# Patient Record
Sex: Female | Born: 1955
Health system: Southern US, Community
[De-identification: ages and names within clinical notes are randomized; demographics above are authoritative.]

## PROBLEM LIST (undated history)

## (undated) DIAGNOSIS — D649 Anemia, unspecified: Secondary | ICD-10-CM

## (undated) DIAGNOSIS — E559 Vitamin D deficiency, unspecified: Secondary | ICD-10-CM

## (undated) DIAGNOSIS — M1990 Unspecified osteoarthritis, unspecified site: Secondary | ICD-10-CM

## (undated) DIAGNOSIS — J45909 Unspecified asthma, uncomplicated: Secondary | ICD-10-CM

## (undated) DIAGNOSIS — K219 Gastro-esophageal reflux disease without esophagitis: Secondary | ICD-10-CM

## (undated) DIAGNOSIS — E0500 Thyrotoxicosis with diffuse goiter without thyrotoxic crisis or storm: Secondary | ICD-10-CM

## (undated) DIAGNOSIS — E039 Hypothyroidism, unspecified: Secondary | ICD-10-CM

## (undated) HISTORY — DX: Anemia, unspecified: D64.9

## (undated) HISTORY — DX: Thyrotoxicosis with diffuse goiter without thyrotoxic crisis or storm: E05.00

## (undated) HISTORY — DX: Unspecified osteoarthritis, unspecified site: M19.90

## (undated) HISTORY — PX: BARTHOLIN GLAND CYST EXCISION: SHX565

## (undated) HISTORY — DX: Vitamin D deficiency, unspecified: E55.9

---

## 2006-08-15 ENCOUNTER — Ambulatory Visit: Payer: Self-pay | Admitting: Internal Medicine

## 2006-08-15 ENCOUNTER — Ambulatory Visit (HOSPITAL_COMMUNITY): Admission: RE | Admit: 2006-08-15 | Discharge: 2006-08-15 | Payer: Self-pay | Admitting: Internal Medicine

## 2013-08-15 ENCOUNTER — Encounter (INDEPENDENT_AMBULATORY_CARE_PROVIDER_SITE_OTHER): Payer: Self-pay

## 2013-12-06 DIAGNOSIS — Z7189 Other specified counseling: Secondary | ICD-10-CM | POA: Insufficient documentation

## 2015-03-21 ENCOUNTER — Other Ambulatory Visit: Payer: Self-pay | Admitting: Ophthalmology

## 2015-03-21 DIAGNOSIS — H532 Diplopia: Secondary | ICD-10-CM

## 2015-03-25 ENCOUNTER — Ambulatory Visit (HOSPITAL_COMMUNITY)
Admission: RE | Admit: 2015-03-25 | Discharge: 2015-03-25 | Disposition: A | Discharge status: Home / Self Care | Payer: BLUE CROSS/BLUE SHIELD | Source: Ambulatory Visit | Attending: Ophthalmology | Admitting: Ophthalmology

## 2015-03-25 DIAGNOSIS — H532 Diplopia: Secondary | ICD-10-CM | POA: Insufficient documentation

## 2015-06-15 DIAGNOSIS — E079 Disorder of thyroid, unspecified: Secondary | ICD-10-CM

## 2015-06-15 DIAGNOSIS — E0500 Thyrotoxicosis with diffuse goiter without thyrotoxic crisis or storm: Secondary | ICD-10-CM

## 2015-06-15 HISTORY — DX: Disorder of thyroid, unspecified: E07.9

## 2015-06-15 HISTORY — DX: Thyrotoxicosis with diffuse goiter without thyrotoxic crisis or storm: E05.00

## 2016-06-21 DIAGNOSIS — Z299 Encounter for prophylactic measures, unspecified: Secondary | ICD-10-CM | POA: Diagnosis not present

## 2016-06-21 DIAGNOSIS — Z789 Other specified health status: Secondary | ICD-10-CM | POA: Diagnosis not present

## 2016-06-21 DIAGNOSIS — R509 Fever, unspecified: Secondary | ICD-10-CM | POA: Diagnosis not present

## 2016-06-21 DIAGNOSIS — J069 Acute upper respiratory infection, unspecified: Secondary | ICD-10-CM | POA: Diagnosis not present

## 2016-08-20 ENCOUNTER — Encounter (INDEPENDENT_AMBULATORY_CARE_PROVIDER_SITE_OTHER): Payer: Self-pay | Admitting: *Deleted

## 2016-09-09 ENCOUNTER — Other Ambulatory Visit (INDEPENDENT_AMBULATORY_CARE_PROVIDER_SITE_OTHER): Payer: Self-pay | Admitting: *Deleted

## 2016-09-09 DIAGNOSIS — Z1211 Encounter for screening for malignant neoplasm of colon: Secondary | ICD-10-CM

## 2016-10-12 DIAGNOSIS — Z6832 Body mass index (BMI) 32.0-32.9, adult: Secondary | ICD-10-CM | POA: Diagnosis not present

## 2016-10-12 DIAGNOSIS — J029 Acute pharyngitis, unspecified: Secondary | ICD-10-CM | POA: Diagnosis not present

## 2016-10-12 DIAGNOSIS — M8580 Other specified disorders of bone density and structure, unspecified site: Secondary | ICD-10-CM | POA: Diagnosis not present

## 2016-10-12 DIAGNOSIS — R509 Fever, unspecified: Secondary | ICD-10-CM | POA: Diagnosis not present

## 2016-10-12 DIAGNOSIS — Z789 Other specified health status: Secondary | ICD-10-CM | POA: Diagnosis not present

## 2016-10-12 DIAGNOSIS — Z299 Encounter for prophylactic measures, unspecified: Secondary | ICD-10-CM | POA: Diagnosis not present

## 2016-10-12 DIAGNOSIS — E039 Hypothyroidism, unspecified: Secondary | ICD-10-CM | POA: Diagnosis not present

## 2016-10-14 DIAGNOSIS — Z6831 Body mass index (BMI) 31.0-31.9, adult: Secondary | ICD-10-CM | POA: Diagnosis not present

## 2016-10-14 DIAGNOSIS — Z299 Encounter for prophylactic measures, unspecified: Secondary | ICD-10-CM | POA: Diagnosis not present

## 2016-10-14 DIAGNOSIS — E039 Hypothyroidism, unspecified: Secondary | ICD-10-CM | POA: Diagnosis not present

## 2016-10-14 DIAGNOSIS — J029 Acute pharyngitis, unspecified: Secondary | ICD-10-CM | POA: Diagnosis not present

## 2016-10-14 DIAGNOSIS — M8580 Other specified disorders of bone density and structure, unspecified site: Secondary | ICD-10-CM | POA: Diagnosis not present

## 2016-10-14 DIAGNOSIS — Z789 Other specified health status: Secondary | ICD-10-CM | POA: Diagnosis not present

## 2016-10-14 DIAGNOSIS — D509 Iron deficiency anemia, unspecified: Secondary | ICD-10-CM | POA: Diagnosis not present

## 2016-10-15 DIAGNOSIS — J029 Acute pharyngitis, unspecified: Secondary | ICD-10-CM | POA: Diagnosis not present

## 2016-11-16 DIAGNOSIS — E0500 Thyrotoxicosis with diffuse goiter without thyrotoxic crisis or storm: Secondary | ICD-10-CM | POA: Diagnosis not present

## 2016-11-17 ENCOUNTER — Encounter (INDEPENDENT_AMBULATORY_CARE_PROVIDER_SITE_OTHER): Payer: Self-pay | Admitting: *Deleted

## 2016-11-17 ENCOUNTER — Telehealth (INDEPENDENT_AMBULATORY_CARE_PROVIDER_SITE_OTHER): Payer: Self-pay | Admitting: *Deleted

## 2016-11-17 MED ORDER — PEG 3350-KCL-NA BICARB-NACL 420 G PO SOLR: 4000.0000 mL | Freq: Once | ORAL | 0 refills | Status: AC

## 2016-11-17 NOTE — Telephone Encounter (Signed)
Patient needs trilyte 

## 2016-12-10 ENCOUNTER — Telehealth (INDEPENDENT_AMBULATORY_CARE_PROVIDER_SITE_OTHER): Payer: Self-pay | Admitting: *Deleted

## 2016-12-10 NOTE — Telephone Encounter (Signed)
Referring MD/PCP: tcs   Procedure: screening  Reason/Indication:  Yes, over 10 yrs ago  Has patient had this procedure before?  no  If so, when, by whom and where?    Is there a family history of colon cancer?  no  Who?  What age when diagnosed?    Is patient diabetic?   no      Does patient have prosthetic heart valve or mechanical valve?  no  Do you have a pacemaker?  no  Has patient ever had endocarditis? no  Has patient had joint replacement within last 12 months?  no  Does patient tend to be constipated or take laxatives? yes  Does patient have a history of alcohol/drug use?  no  Is patient on Coumadin, Plavix and/or Aspirin? no  Medications: levothyroxine 125 mcg daily, zantac prn  Allergies: relfen  Medication Adjustment per Dr Laural Golden:   Procedure date & time: 01/06/17 at 73

## 2016-12-13 NOTE — Telephone Encounter (Signed)
agree

## 2017-01-06 ENCOUNTER — Ambulatory Visit (HOSPITAL_COMMUNITY)
Admission: RE | Admit: 2017-01-06 | Discharge: 2017-01-06 | Disposition: A | Discharge status: Home / Self Care | Payer: BLUE CROSS/BLUE SHIELD | Source: Ambulatory Visit | Attending: Internal Medicine | Admitting: Internal Medicine

## 2017-01-06 ENCOUNTER — Encounter (HOSPITAL_COMMUNITY)
Admission: RE | Disposition: A | Discharge status: Home / Self Care | Payer: Self-pay | Source: Ambulatory Visit | Attending: Internal Medicine

## 2017-01-06 ENCOUNTER — Encounter (HOSPITAL_COMMUNITY): Payer: Self-pay

## 2017-01-06 DIAGNOSIS — E039 Hypothyroidism, unspecified: Secondary | ICD-10-CM | POA: Diagnosis not present

## 2017-01-06 DIAGNOSIS — Z1211 Encounter for screening for malignant neoplasm of colon: Secondary | ICD-10-CM | POA: Insufficient documentation

## 2017-01-06 DIAGNOSIS — Z79899 Other long term (current) drug therapy: Secondary | ICD-10-CM | POA: Diagnosis not present

## 2017-01-06 DIAGNOSIS — J45909 Unspecified asthma, uncomplicated: Secondary | ICD-10-CM | POA: Insufficient documentation

## 2017-01-06 DIAGNOSIS — K219 Gastro-esophageal reflux disease without esophagitis: Secondary | ICD-10-CM | POA: Insufficient documentation

## 2017-01-06 DIAGNOSIS — K644 Residual hemorrhoidal skin tags: Secondary | ICD-10-CM | POA: Insufficient documentation

## 2017-01-06 DIAGNOSIS — D120 Benign neoplasm of cecum: Secondary | ICD-10-CM | POA: Insufficient documentation

## 2017-01-06 DIAGNOSIS — K6289 Other specified diseases of anus and rectum: Secondary | ICD-10-CM | POA: Diagnosis not present

## 2017-01-06 HISTORY — DX: Gastro-esophageal reflux disease without esophagitis: K21.9

## 2017-01-06 HISTORY — PX: POLYPECTOMY: SHX5525

## 2017-01-06 HISTORY — DX: Hypothyroidism, unspecified: E03.9

## 2017-01-06 HISTORY — PX: COLONOSCOPY: SHX5424

## 2017-01-06 HISTORY — DX: Unspecified asthma, uncomplicated: J45.909

## 2017-01-06 SURGERY — COLONOSCOPY
Anesthesia: Moderate Sedation

## 2017-01-06 MED ORDER — STERILE WATER FOR IRRIGATION IR SOLN
Status: DC | PRN
  Administered 2017-01-06: 09:00:00

## 2017-01-06 MED ORDER — MIDAZOLAM HCL 5 MG/5ML IJ SOLN
INTRAMUSCULAR | Status: AC
  Filled 2017-01-06: qty 10

## 2017-01-06 MED ORDER — MIDAZOLAM HCL 5 MG/5ML IJ SOLN
INTRAMUSCULAR | Status: DC | PRN
  Administered 2017-01-06 (×2): 2 mg via INTRAVENOUS
  Administered 2017-01-06: 1 mg via INTRAVENOUS

## 2017-01-06 MED ORDER — MEPERIDINE HCL 50 MG/ML IJ SOLN
INTRAMUSCULAR | Status: DC | PRN
  Administered 2017-01-06 (×2): 25 mg via INTRAVENOUS

## 2017-01-06 MED ORDER — SODIUM CHLORIDE 0.9 % IV SOLN
INTRAVENOUS | Status: DC
  Administered 2017-01-06: 1000 mL via INTRAVENOUS

## 2017-01-06 MED ORDER — MEPERIDINE HCL 50 MG/ML IJ SOLN
INTRAMUSCULAR | Status: AC
  Filled 2017-01-06: qty 1

## 2017-01-06 NOTE — Op Note (Signed)
[facility] Patient Name: Misty Gomez Procedure Date: 01/06/2017 8:49 AM MRN: [id number] Date of Birth: 1955-10-22 Attending MD: Hildred Laser , MD CSN: [id number] Age: 61 Admit Type: Outpatient Procedure:                Colonoscopy Indications:              Screening for colorectal malignant neoplasm Providers:                Hildred Laser, MD, Otis Peak B. Sharon Seller, RN, Randa Spike, Technician Referring MD:             Virl Son. Hairfield, FNP Medicines:                Meperidine 50 mg IV, Midazolam 5 mg IV Complications:            No immediate complications. Estimated Blood Loss:     Estimated blood loss was minimal. Procedure:                Pre-Anesthesia Assessment:                           - Prior to the procedure, a History and Physical                            was performed, and patient medications and                            allergies were reviewed. The patient's tolerance of                            previous anesthesia was also reviewed. The risks                            and benefits of the procedure and the sedation                            options and risks were discussed with the patient.                            All questions were answered, and informed consent                            was obtained. Prior Anticoagulants: The patient has                            taken no previous anticoagulant or antiplatelet                            agents. ASA Grade Assessment: II - A patient with                            mild systemic disease. After reviewing the risks  and benefits, the patient was deemed in                            satisfactory condition to undergo the procedure.                           After obtaining informed consent, the colonoscope                            was passed under direct vision. Throughout the                            procedure, the patient's blood pressure,  pulse, and                            oxygen saturations were monitored continuously. The                            [id number] ([id number]) scope was introduced through                            the anus and advanced to the the cecum, identified                            by appendiceal orifice and ileocecal valve. The                            colonoscopy was performed without difficulty. The                            patient tolerated the procedure well. The quality                            of the bowel preparation was good. The ileocecal                            valve, appendiceal orifice, and rectum were                            photographed. Scope In: 9:04:14 AM Scope Out: 9:29:50 AM Scope Withdrawal Time: 0 hours 14 minutes 13 seconds  Total Procedure Duration: 0 hours 25 minutes 36 seconds  Findings:      The perianal and digital rectal examinations were normal.      A small polyp was found in the cecum. The polyp was sessile. Biopsies       were taken with a cold forceps for histology.      The exam was otherwise normal throughout the examined colon.      External hemorrhoids were found during retroflexion. The hemorrhoids       were small.      Anal papilla(e) were hypertrophied. Impression:               - One small polyp in the cecum. Biopsied.                           -  External hemorrhoids.                           - Anal papilla(e) were hypertrophied. Moderate Sedation:      Moderate (conscious) sedation was administered by the endoscopy nurse       and supervised by the endoscopist. The following parameters were       monitored: oxygen saturation, heart rate, blood pressure, CO2       capnography and response to care. Total physician intraservice time was       30 minutes. Recommendation:           - Patient has a contact number available for                            emergencies. The signs and symptoms of potential                            delayed  complications were discussed with the                            patient. Return to normal activities tomorrow.                            Written discharge instructions were provided to the                            patient.                           - Resume previous diet today.                           - Continue present medications.                           - Await pathology results.                           - Repeat colonoscopy date to be determined after                            pending pathology results are reviewed. Procedure Code(s):        --- Professional ---                           [phone], Colonoscopy, flexible; with biopsy, single                            or multiple                           99152, Moderate sedation services provided by the                            same physician or other qualified health care  professional performing the diagnostic or                            therapeutic service that the sedation supports,                            requiring the presence of an independent trained                            observer to assist in the monitoring of the                            patient's level of consciousness and physiological                            status; initial 15 minutes of intraservice time,                            patient age 26 years or older                           [id number], Moderate sedation services; each additional                            15 minutes intraservice time Diagnosis Code(s):        --- Professional ---                           Z12.11, Encounter for screening for malignant                            neoplasm of colon                           D12.0, Benign neoplasm of cecum                           K62.89, Other specified diseases of anus and rectum                           K64.4, Residual hemorrhoidal skin tags CPT copyright 2016 [employer]. All rights reserved. The  codes documented in this report are preliminary and upon coder review may  be revised to meet current compliance requirements. Hildred Laser, MD Hildred Laser, MD 01/06/2017 9:36:15 AM This report has been signed electronically. Number of Addenda: 0

## 2017-01-06 NOTE — Discharge Instructions (Signed)
Colonoscopy, Adult, Care After °This sheet gives you information about how to care for yourself after your procedure. Your health care provider may also give you more specific instructions. If you have problems or questions, contact your health care provider. °What can I expect after the procedure? °After the procedure, it is common to have: °· A small amount of blood in your stool for 24 hours after the procedure. °· Some gas. °· Mild abdominal cramping or bloating. ° °Follow these instructions at home: °General instructions ° °· For the first 24 hours after the procedure: °? Do not drive or use machinery. °? Do not sign important documents. °? Do not drink alcohol. °? Do your regular daily activities at a slower pace than normal. °? Eat soft, easy-to-digest foods. °? Rest often. °· Take over-the-counter or prescription medicines only as told by your health care provider. °· It is up to you to get the results of your procedure. Ask your health care provider, or the department performing the procedure, when your results will be ready. °Relieving cramping and bloating °· Try walking around when you have cramps or feel bloated. °· Apply heat to your abdomen as told by your health care provider. Use a heat source that your health care provider recommends, such as a moist heat pack or a heating pad. °? Place a towel between your skin and the heat source. °? Leave the heat on for 20-30 minutes. °? Remove the heat if your skin turns bright red. This is especially important if you are unable to feel pain, heat, or cold. You may have a greater risk of getting burned. °Eating and drinking °· Drink enough fluid to keep your urine clear or pale yellow. °· Resume your normal diet as instructed by your health care provider. Avoid heavy or fried foods that are hard to digest. °· Avoid drinking alcohol for as long as instructed by your health care provider. °Contact a health care provider if: °· You have blood in your stool 2-3  days after the procedure. °Get help right away if: °· You have more than a small spotting of blood in your stool. °· You pass large blood clots in your stool. °· Your abdomen is swollen. °· You have nausea or vomiting. °· You have a fever. °· You have increasing abdominal pain that is not relieved with medicine. °This information is not intended to replace advice given to you by your health care provider. Make sure you discuss any questions you have with your health care provider. °Document Released: 01/13/2004 Document Revised: 02/23/2016 Document Reviewed: 08/12/2015 °Elsevier Interactive Patient Education © 2018 [employer] °Resume usual medications and diet. °No driving for 24 hours. °Physician will call with biopsy results. °

## 2017-01-06 NOTE — H&P (Signed)
Misty Gomez is an 61 y.o. female.   Chief Complaint: Patient is here for colonoscopy. HPI: Patient 61 year old Caucasian female who is here for screening colonoscopy. She denies abdominal pain change in bowel habits or rectal bleeding. Her bowels move once a week and it has been her pattern for several years. Last colonoscopy was in March 2008. Family's reason negative for CRC.  Past Medical History:  Diagnosis Date  . Asthma   . GERD (gastroesophageal reflux disease)   . Hypothyroidism     Past Surgical History:  Procedure Laterality Date  . BARTHOLIN GLAND CYST EXCISION      History reviewed. No pertinent family history. Social History:  reports that she has never smoked. She has never used smokeless tobacco. She reports that she does not drink alcohol or use drugs.  Allergies:  Allergies  Allergen Reactions  . Relafen [Nabumetone] Rash    All over    Medications Prior to Admission  Medication Sig Dispense Refill  . albuterol (PROVENTIL HFA;VENTOLIN HFA) 108 (90 Base) MCG/ACT inhaler Inhale 1-2 puffs into the lungs every 4 (four) hours as needed for wheezing or shortness of breath.    . levothyroxine (SYNTHROID, LEVOTHROID) 125 MCG tablet Take 1 tablet by mouth at bedtime.   4  . ranitidine (ZANTAC) 150 MG capsule Take 150 mg by mouth daily as needed for heartburn.      No results found for this or any previous visit (from the past 48 hour(s)). No results found.  ROS  Blood pressure 123/70, pulse (!) 51, temperature 98 F (36.7 C), temperature source Oral, resp. rate 16, SpO2 100 %. Physical Exam  Constitutional: She appears well-developed and well-nourished.  HENT:  Mouth/Throat: Oropharynx is clear and moist.  Eyes: Conjunctivae are normal. No scleral icterus.  Neck: No thyromegaly present.  Cardiovascular: Normal rate, regular rhythm and normal heart sounds.   No murmur heard. Respiratory: Effort normal and breath sounds normal.  GI: Soft. She exhibits no  distension and no mass. There is no tenderness.  Musculoskeletal: She exhibits no edema.  Lymphadenopathy:    She has no cervical adenopathy.  Neurological: She is alert.  Skin: Skin is warm and dry.     Assessment/Plan Average risk screening colonoscopy.  Hildred Laser, MD 01/06/2017, 8:55 AM

## 2017-01-10 ENCOUNTER — Encounter (HOSPITAL_COMMUNITY): Payer: Self-pay | Admitting: Internal Medicine

## 2017-01-20 DIAGNOSIS — E039 Hypothyroidism, unspecified: Secondary | ICD-10-CM | POA: Diagnosis not present

## 2017-01-20 DIAGNOSIS — Z6832 Body mass index (BMI) 32.0-32.9, adult: Secondary | ICD-10-CM | POA: Diagnosis not present

## 2017-01-20 DIAGNOSIS — Z1231 Encounter for screening mammogram for malignant neoplasm of breast: Secondary | ICD-10-CM | POA: Diagnosis not present

## 2017-01-20 DIAGNOSIS — Z0000 Encounter for general adult medical examination without abnormal findings: Secondary | ICD-10-CM | POA: Diagnosis not present

## 2017-01-20 DIAGNOSIS — Z1389 Encounter for screening for other disorder: Secondary | ICD-10-CM | POA: Diagnosis not present

## 2017-01-20 DIAGNOSIS — Z79899 Other long term (current) drug therapy: Secondary | ICD-10-CM | POA: Diagnosis not present

## 2017-01-20 DIAGNOSIS — Z299 Encounter for prophylactic measures, unspecified: Secondary | ICD-10-CM | POA: Diagnosis not present

## 2017-01-20 DIAGNOSIS — E559 Vitamin D deficiency, unspecified: Secondary | ICD-10-CM | POA: Diagnosis not present

## 2017-01-20 DIAGNOSIS — D649 Anemia, unspecified: Secondary | ICD-10-CM | POA: Diagnosis not present

## 2017-02-07 DIAGNOSIS — Z1231 Encounter for screening mammogram for malignant neoplasm of breast: Secondary | ICD-10-CM | POA: Diagnosis not present

## 2017-07-12 DIAGNOSIS — J189 Pneumonia, unspecified organism: Secondary | ICD-10-CM | POA: Diagnosis not present

## 2017-07-12 DIAGNOSIS — R05 Cough: Secondary | ICD-10-CM | POA: Diagnosis not present

## 2017-07-12 DIAGNOSIS — J4541 Moderate persistent asthma with (acute) exacerbation: Secondary | ICD-10-CM | POA: Diagnosis not present

## 2017-07-12 DIAGNOSIS — J069 Acute upper respiratory infection, unspecified: Secondary | ICD-10-CM | POA: Diagnosis not present

## 2017-11-21 DIAGNOSIS — Z6831 Body mass index (BMI) 31.0-31.9, adult: Secondary | ICD-10-CM | POA: Diagnosis not present

## 2017-11-21 DIAGNOSIS — Z01419 Encounter for gynecological examination (general) (routine) without abnormal findings: Secondary | ICD-10-CM | POA: Diagnosis not present

## 2017-12-05 DIAGNOSIS — E0580 Other thyrotoxicosis without thyrotoxic crisis or storm: Secondary | ICD-10-CM | POA: Diagnosis not present

## 2017-12-05 DIAGNOSIS — E0500 Thyrotoxicosis with diffuse goiter without thyrotoxic crisis or storm: Secondary | ICD-10-CM | POA: Diagnosis not present

## 2018-01-17 ENCOUNTER — Encounter: Payer: Self-pay | Admitting: Family Medicine

## 2018-02-22 ENCOUNTER — Encounter: Payer: Self-pay | Admitting: Family Medicine

## 2018-02-24 ENCOUNTER — Ambulatory Visit (INDEPENDENT_AMBULATORY_CARE_PROVIDER_SITE_OTHER): Payer: BLUE CROSS/BLUE SHIELD

## 2018-02-24 ENCOUNTER — Encounter: Payer: Self-pay | Admitting: Family Medicine

## 2018-02-24 ENCOUNTER — Ambulatory Visit: Payer: BLUE CROSS/BLUE SHIELD | Admitting: Family Medicine

## 2018-02-24 VITALS — BP 124/82 | HR 58 | Temp 97.4°F | Ht >= 80 in | Wt 189.0 lb

## 2018-02-24 DIAGNOSIS — E890 Postprocedural hypothyroidism: Secondary | ICD-10-CM

## 2018-02-24 DIAGNOSIS — Z13228 Encounter for screening for other metabolic disorders: Secondary | ICD-10-CM | POA: Diagnosis not present

## 2018-02-24 DIAGNOSIS — M8580 Other specified disorders of bone density and structure, unspecified site: Secondary | ICD-10-CM

## 2018-02-24 DIAGNOSIS — M8588 Other specified disorders of bone density and structure, other site: Secondary | ICD-10-CM | POA: Insufficient documentation

## 2018-02-24 DIAGNOSIS — Z1239 Encounter for other screening for malignant neoplasm of breast: Secondary | ICD-10-CM

## 2018-02-24 DIAGNOSIS — D649 Anemia, unspecified: Secondary | ICD-10-CM | POA: Diagnosis not present

## 2018-02-24 DIAGNOSIS — E559 Vitamin D deficiency, unspecified: Secondary | ICD-10-CM | POA: Insufficient documentation

## 2018-02-24 DIAGNOSIS — Z7689 Persons encountering health services in other specified circumstances: Secondary | ICD-10-CM

## 2018-02-24 DIAGNOSIS — Z13220 Encounter for screening for lipoid disorders: Secondary | ICD-10-CM

## 2018-02-24 DIAGNOSIS — Z1231 Encounter for screening mammogram for malignant neoplasm of breast: Secondary | ICD-10-CM

## 2018-02-24 NOTE — Progress Notes (Signed)
Subjective: YB:OFBPZWCHE care, hypothyroidism HPI: Misty Gomez is a 62 y.o. female presenting to clinic today for:  1.  Hypothyroidism Patient reports that she has post radio ablative hypothyroidism since 2010.  She was diagnosed with Graves' disease and has been on Synthroid 125 mcg.  She denies any tremor, insomnia, anxiety, change in voice or difficulty swallowing.  She does have some visual complications of Graves' disease, which she is treated for by her ophthalmologist.  She notes she is to see double but now seeing these in the singular.  No diarrhea or constipation.  2.  Osteopenia Patient reports history of osteopenia.  Last bone density was a few years ago.  She also has history of vitamin D deficiency.  She reports regular physical activity and good balanced diet.  3.  Acid reflux Patient reports intermittent acid reflux that is controlled by Zantac.  She notes that it is predominantly related to what she eats.  Denies any hematochezia, melena, nausea or vomiting.  4.  Preventive care Patient reports that she had her Pap smear done by Dr. Adah Perl last year and this was normal.  She gets her mammograms done at the [facility] center and is planning on scheduling this soon.  She needs her pneumococcal and tetanus vaccines.  Past Medical History:  Diagnosis Date  . Anemia   . Arthritis   . Asthma   . GERD (gastroesophageal reflux disease)   . Hypothyroidism   . Thyroid eye disease 2017  . Vitamin D deficiency    Past Surgical History:  Procedure Laterality Date  . BARTHOLIN GLAND CYST EXCISION    . COLONOSCOPY N/A 01/06/2017   Procedure: COLONOSCOPY;  Surgeon: Rogene Houston, MD;  Location: [facility];  Service: Endoscopy;  Laterality: N/A;  930  . POLYPECTOMY  01/06/2017   Procedure: POLYPECTOMY;  Surgeon: Rogene Houston, MD;  Location: [facility];  Service: Endoscopy;;  colon   Social History   Socioeconomic History  . Marital status: Married    Spouse  name: Not on file  . Number of children: 0  . Years of education: Not on file  . Highest education level: Not on file  Occupational History  . Not on file  Social Needs  . Financial resource strain: Not on file  . Food insecurity:    Worry: Not on file    Inability: Not on file  . Transportation needs:    Medical: Not on file    Non-medical: Not on file  Tobacco Use  . Smoking status: Never Smoker  . Smokeless tobacco: Never Used  Substance and Sexual Activity  . Alcohol use: No  . Drug use: No  . Sexual activity: Not on file  Lifestyle  . Physical activity:    Days per week: Not on file    Minutes per session: Not on file  . Stress: Not on file  Relationships  . Social connections:    Talks on phone: Not on file    Gets together: Not on file    Attends religious service: Not on file    Active member of club or organization: Not on file    Attends meetings of clubs or organizations: Not on file    Relationship status: Not on file  . Intimate partner violence:    Fear of current or ex partner: Not on file    Emotionally abused: Not on file    Physically abused: Not on file    Forced sexual activity:  Not on file  Other Topics Concern  . Not on file  Social History Narrative   Ms. 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   She walks several times weekly.   No outpatient medications have been marked as taking for the 02/24/18 encounter (Office Visit) with Gottschalk, Ashly M, DO.   Family History  Problem Relation Age of Onset  . CVA Mother 60  . Rheum arthritis Mother   . Heart attack Mother 60  . Heart attack Father 40       had first heart attack in early 40s  . Heart disease Father   . Cancer Brother   . Heart disease Brother   . Cancer Brother   . Diabetes Brother   . Hypertension Brother   . Pancreatic disease Maternal Grandmother   . Diabetes Maternal Grandmother   . Asthma Maternal  Grandfather   . Heart attack Maternal Grandfather   . Heart attack Paternal Grandmother   . Heart attack Paternal Grandfather    Allergies  Allergen Reactions  . Relafen [Nabumetone] Rash    All over     Health Maintenance: TDap, PNA vaccine.  ROS: Per HPI  Objective: Office vital signs reviewed. BP 124/82   Pulse (!) 58   Temp (!) 97.4 F (36.3 C) (Oral)   Ht 7' (2.134 m)   Wt 189 lb (85.7 kg)   BMI 18.83 kg/m   Physical Examination:  General: Awake, alert, well nourished, No acute distress HEENT: Normal    Neck: No masses palpated. No lymphadenopathy; no palpable thyroid nodules or goiter    Eyes: PERRLA, extraocular movement in tact, sclera white, very mild exophthalmos    Throat: moist mucus membranes Cardio: Slightly bradycardic rate and regular rhythm, S1S2 heard, no murmurs appreciated Pulm: clear to auscultation bilaterally, no wheezes, rhonchi or rales; normal work of breathing on room air Extremities: warm, well perfused, No edema, cyanosis or clubbing; +2 pulses bilaterally MSK: Normal gait and station Skin: dry; intact; no rashes or lesions; normal temperature Neuro: No focal neurologic deficits; no resting tremor  Assessment/ Plan: 62 y.o. female   1. Postablative hypothyroidism Check thyroid panel.  We will plan to refill Synthroid 125 mcg as long as her levels are stable.  Plan for yearly check pending stability - Thyroid Panel With TSH  2. Anemia, unspecified type History of iron deficiency anemia that has been intermittent per patient's report.  Will check CBC - CBC with Differential  3. Establishing care with new doctor, encounter for Will need to complete a release of information form for Dr. Galloway for Pap smear.  Other records have been scanned into the chart.  4. Vitamin D deficiency - VITAMIN D 25 Hydroxy (Vit-D Deficiency, Fractures) - DG [facility] DEXA  5. Lipid screening - Lipid Panel  6. Screening for metabolic disorder -  CMP14+EGFR  7. Osteopenia, unspecified location - DG [facility] DEXA  8. Screening for breast cancer Once performed at [facility] in [place]. - MM Digital Screening; Future   Ashly M Gottschalk, DO [facility] [phone]   

## 2018-02-25 LAB — CMP14+EGFR
ALBUMIN: 4.4 g/dL (ref 3.6–4.8)
ALK PHOS: 100 IU/L (ref 39–117)
ALT: 15 IU/L (ref 0–32)
AST: 28 IU/L (ref 0–40)
Albumin/Globulin Ratio: 1.7 (ref 1.2–2.2)
BILIRUBIN TOTAL: 0.5 mg/dL (ref 0.0–1.2)
BUN / CREAT RATIO: 16 (ref 12–28)
BUN: 13 mg/dL (ref 8–27)
CO2: 24 mmol/L (ref 20–29)
Calcium: 9.5 mg/dL (ref 8.7–10.3)
Chloride: 103 mmol/L (ref 96–106)
Creatinine, Ser: 0.81 mg/dL (ref 0.57–1.00)
GFR calc Af Amer: 90 mL/min/{1.73_m2} (ref >59)
GFR calc non Af Amer: 78 mL/min/{1.73_m2} (ref >59)
GLUCOSE: 85 mg/dL (ref 65–99)
Globulin, Total: 2.6 g/dL (ref 1.5–4.5)
Potassium: 4.4 mmol/L (ref 3.5–5.2)
Sodium: 140 mmol/L (ref 134–144)
Total Protein: 7 g/dL (ref 6.0–8.5)

## 2018-02-25 LAB — CBC WITH DIFFERENTIAL/PLATELET
Basophils Absolute: 0 10*3/uL (ref 0.0–0.2)
Basos: 1 %
EOS (ABSOLUTE): 0.3 10*3/uL (ref 0.0–0.4)
Eos: 6 %
Hematocrit: 37.6 % (ref 34.0–46.6)
Hemoglobin: 11.6 g/dL (ref 11.1–15.9)
Immature Grans (Abs): 0 10*3/uL (ref 0.0–0.1)
Immature Granulocytes: 0 %
LYMPHS ABS: 1.6 10*3/uL (ref 0.7–3.1)
Lymphs: 31 %
MCH: 23.6 pg — ABNORMAL LOW (ref 26.6–33.0)
MCHC: 30.9 g/dL — AB (ref 31.5–35.7)
MCV: 77 fL — ABNORMAL LOW (ref 79–97)
MONOCYTES: 9 %
MONOS ABS: 0.4 10*3/uL (ref 0.1–0.9)
NEUTROS PCT: 53 %
Neutrophils Absolute: 2.8 10*3/uL (ref 1.4–7.0)
Platelets: 264 10*3/uL (ref 150–450)
RBC: 4.91 x10E6/uL (ref 3.77–5.28)
RDW: 15.2 % (ref 12.3–15.4)
WBC: 5.1 10*3/uL (ref 3.4–10.8)

## 2018-02-25 LAB — THYROID PANEL WITH TSH
Free Thyroxine Index: 2.4 (ref 1.2–4.9)
T3 Uptake Ratio: 28 % (ref 24–39)
T4 TOTAL: 8.5 ug/dL (ref 4.5–12.0)
TSH: 1.87 u[IU]/mL (ref 0.450–4.500)

## 2018-02-25 LAB — LIPID PANEL
CHOL/HDL RATIO: 4 ratio (ref 0.0–4.4)
Cholesterol, Total: 267 mg/dL — ABNORMAL HIGH (ref 100–199)
HDL: 66 mg/dL (ref >39)
LDL Calculated: 185 mg/dL — ABNORMAL HIGH (ref 0–99)
Triglycerides: 78 mg/dL (ref 0–149)
VLDL Cholesterol Cal: 16 mg/dL (ref 5–40)

## 2018-02-25 LAB — VITAMIN D 25 HYDROXY (VIT D DEFICIENCY, FRACTURES): Vit D, 25-Hydroxy: 27.6 ng/mL — ABNORMAL LOW (ref 30.0–100.0)

## 2018-03-09 DIAGNOSIS — Z1231 Encounter for screening mammogram for malignant neoplasm of breast: Secondary | ICD-10-CM | POA: Diagnosis not present

## 2018-03-09 LAB — HM MAMMOGRAPHY

## 2018-03-10 NOTE — Progress Notes (Unsigned)
a 

## 2018-03-23 DIAGNOSIS — Z23 Encounter for immunization: Secondary | ICD-10-CM | POA: Diagnosis not present

## 2018-04-21 ENCOUNTER — Other Ambulatory Visit: Payer: Self-pay | Admitting: Family Medicine

## 2018-07-03 ENCOUNTER — Encounter: Payer: Self-pay | Admitting: Family Medicine

## 2018-07-03 DIAGNOSIS — H903 Sensorineural hearing loss, bilateral: Secondary | ICD-10-CM | POA: Diagnosis not present

## 2019-02-23 ENCOUNTER — Encounter: Payer: BLUE CROSS/BLUE SHIELD | Admitting: Family Medicine

## 2019-03-12 ENCOUNTER — Other Ambulatory Visit: Payer: Self-pay

## 2019-03-13 ENCOUNTER — Ambulatory Visit (INDEPENDENT_AMBULATORY_CARE_PROVIDER_SITE_OTHER): Payer: BC Managed Care – PPO | Admitting: Family Medicine

## 2019-03-13 ENCOUNTER — Encounter: Payer: Self-pay | Admitting: Family Medicine

## 2019-03-13 VITALS — BP 116/73 | HR 62 | Temp 98.4°F | Ht 66.0 in | Wt 187.0 lb

## 2019-03-13 DIAGNOSIS — E890 Postprocedural hypothyroidism: Secondary | ICD-10-CM

## 2019-03-13 DIAGNOSIS — M8580 Other specified disorders of bone density and structure, unspecified site: Secondary | ICD-10-CM | POA: Diagnosis not present

## 2019-03-13 DIAGNOSIS — E559 Vitamin D deficiency, unspecified: Secondary | ICD-10-CM | POA: Diagnosis not present

## 2019-03-13 DIAGNOSIS — Z0000 Encounter for general adult medical examination without abnormal findings: Secondary | ICD-10-CM

## 2019-03-13 DIAGNOSIS — Z0001 Encounter for general adult medical examination with abnormal findings: Secondary | ICD-10-CM | POA: Diagnosis not present

## 2019-03-13 DIAGNOSIS — E782 Mixed hyperlipidemia: Secondary | ICD-10-CM | POA: Diagnosis not present

## 2019-03-13 NOTE — Progress Notes (Signed)
Misty Gomez is a 63 y.o. female presents to office today for annual physical exam examination.    Concerns today include: 1. Hypothyroidism Patient reports compliance with Synthroid.  Denies any change in voice, difficulty swallowing, tremor or heart palpitation.  No unplanned weight loss.  She has been actively working on weight loss through lifestyle modification.  Diet: Eats what she wants, no structured, Exercise: Walking about 3 miles per day and weightlifting Last eye exam: Yearly Last dental exam: Yearly Last colonoscopy: Up-to-date Last mammogram: Up-to-date Last pap smear: Done w/ Misty Gomez 2018 normal. Refills needed today: Synthroid Immunizations needed: Flu Vaccine: wants to hold off.  Tetanus was administered 2 years ago in [place]  Past Medical History:  Diagnosis Date  . Anemia   . Arthritis   . Asthma   . GERD (gastroesophageal reflux disease)   . Hypothyroidism   . Thyroid eye disease 2017  . Vitamin D deficiency    Social History   Socioeconomic History  . Marital status: Married    Spouse name: Not on file  . Number of children: 0  . Years of education: Not on file  . Highest education level: Not on file  Occupational History  . Not on file  Social Needs  . Financial resource strain: Not on file  . Food insecurity    Worry: Not on file    Inability: Not on file  . Transportation needs    Medical: Not on file    Non-medical: Not on file  Tobacco Use  . Smoking status: Never Smoker  . Smokeless tobacco: Never Used  Substance and Sexual Activity  . Alcohol use: No  . Drug use: No  . Sexual activity: Not on file  Lifestyle  . Physical activity    Days per week: Not on file    Minutes per session: Not on file  . Stress: Not on file  Relationships  . Social Herbalist on phone: Not on file    Gets together: Not on file    Attends religious service: Not on file    Active member of club or organization: Not on file    Attends  meetings of clubs or organizations: Not on file    Relationship status: Not on file  . Intimate partner violence    Fear of current or ex partner: Not on file    Emotionally abused: Not on file    Physically abused: Not on file    Forced sexual activity: Not on file  Other Topics Concern  . 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   She walks several times weekly.   Past Surgical History:  Procedure Laterality Date  . BARTHOLIN GLAND CYST EXCISION    . COLONOSCOPY N/A 01/06/2017   Procedure: COLONOSCOPY;  Surgeon: Rogene Houston, MD;  [facility];  Service: Endoscopy;  Laterality: N/A;  930  . POLYPECTOMY  01/06/2017   Procedure: POLYPECTOMY;  Surgeon: Rogene Houston, MD;  [facility]: [facility];  Service: Endoscopy;;  colon   Family History  Problem Relation Age of Onset  . CVA Mother 70  . Rheum arthritis Mother   . Heart attack Mother 50  . Heart attack Father 60       had first heart attack in early 58s  . Heart disease Father   .  Cancer Brother   . Heart disease Brother   . Cancer Brother   . Diabetes Brother   . Hypertension Brother   . Pancreatic disease Maternal Grandmother   . Diabetes Maternal Grandmother   . Asthma Maternal Grandfather   . Heart attack Maternal Grandfather   . Heart attack Paternal Grandmother   . Heart attack Paternal Grandfather     Current Outpatient Medications:  .  albuterol (PROVENTIL HFA;VENTOLIN HFA) 108 (90 Base) MCG/ACT inhaler, Inhale 1-2 puffs into the lungs every 4 (four) hours as needed for wheezing or shortness of breath., Disp: , Rfl:  .  levothyroxine (SYNTHROID, LEVOTHROID) 125 MCG tablet, TAKE 1 TABLET BY MOUTH EVERY DAY, Disp: 90 tablet, Rfl: 4 .  ranitidine (ZANTAC) 150 MG capsule, Take 150 mg by mouth daily as needed for heartburn., Disp: , Rfl:   Allergies  Allergen Reactions  . Relafen  [Nabumetone] Rash    All over     ROS: Review of Systems Constitutional: negative Eyes: positive for contacts/glasses Ears, nose, mouth, throat, and face: negative Respiratory: negative Cardiovascular: negative Gastrointestinal: positive for constipation Genitourinary:negative Integument/breast: negative Hematologic/lymphatic: negative Musculoskeletal:negative Neurological: Chronic numbness on the posterior aspect of the upper extremities bilaterally.  She has history of brachial plexus surgery Behavioral/Psych: negative Endocrine: negative Allergic/Immunologic: negative    Physical exam BP 116/73   Pulse 62   Temp 98.4 F (36.9 C) (Temporal)   Ht _0  (1.676 m)   Wt 187 lb (84.8 kg)   BMI 30.18 kg/m  General appearance: alert, cooperative, appears stated age and no distress Head: Normocephalic, without obvious abnormality, atraumatic Eyes: negative findings: lids and lashes normal, conjunctivae and sclerae normal, corneas clear and pupils equal, round, reactive to light and accomodation; mild exophthalmos Ears: normal TM's and external ear canals both ears Nose: Nares normal. Septum midline. Mucosa normal. No drainage or sinus tenderness. Throat: lips, mucosa, and tongue normal; teeth and gums normal Neck: no adenopathy, no carotid bruit, supple, symmetrical, trachea midline and thyroid not enlarged, symmetric, no tenderness/mass/nodules Back: symmetric, no curvature. ROM normal. No CVA tenderness. Lungs: clear to auscultation bilaterally Heart: regular rate and rhythm, S1, S2 normal, no murmur, click, rub or gallop Abdomen: soft, non-tender; bowel sounds normal; no masses,  no organomegaly Extremities: extremities normal, atraumatic, no cyanosis or edema Pulses: 2+ and symmetric Skin: Skin color, texture, turgor normal. No rashes or lesions ; normal temperature Lymph nodes: Cervical, supraclavicular, and axillary nodes normal. Neurologic: Alert and oriented X 3, normal  strength and tone. Normal symmetric reflexes. Normal coordination and gait; no tremor Psych: Mood stable, speech normal, affect appropriate, pleasant and interactive Depression screen [facility] 2/9 03/13/2019 02/24/2018  Decreased Interest 0 0  Down, Depressed, Hopeless 0 0  PHQ - 2 Score 0 0  Altered sleeping 0 -  Tired, decreased energy 0 -  Change in appetite 0 -  Feeling bad or failure about yourself  0 -  Trouble concentrating 0 -  Moving slowly or fidgety/restless 0 -  Suicidal thoughts 0 -  PHQ-9 Score 0 -    Assessment/ Plan: Misty Gomez here for annual physical exam.   1. Annual physical exam Declined hepatitis C and HIV screens.  Will obtain Pap smear from Misty. Adah Gomez.  Release of information form completed today.  2. Postablative hypothyroidism Asymptomatic.  Check thyroid panel - Thyroid Panel With TSH  3. Vitamin D deficiency - VITAMIN D 25 Hydroxy (Vit-D Deficiency, Fractures)  4. Osteopenia, unspecified location - CMP14+EGFR - VITAMIN  D 25 Hydroxy (Vit-D Deficiency, Fractures)  5. Mixed hyperlipidemia Not fasting so only direct LDL obtained given elevation last year. - LDL Cholesterol, Direct   Counseled on healthy lifestyle choices, including diet (rich in fruits, vegetables and lean meats and low in salt and simple carbohydrates) and exercise (at least 30 minutes of moderate physical activity daily).  Patient to follow up in 1 year for annual exam or sooner if needed.  Blas Riches M. Lajuana Ripple, DO

## 2019-03-13 NOTE — Patient Instructions (Signed)
You had labs performed today.  You will be contacted with the results of the labs once they are available, usually in the next 3 business days for routine lab work.  If you have an active my chart account, they will be released to your MyChart.  If you prefer to have these labs released to you via telephone, please let us know.  If you had a pap smear or biopsy performed, expect to be contacted in about 7-10 days.  Health Maintenance, Female Adopting a healthy lifestyle and getting preventive care are important in promoting health and wellness. Ask your health care provider about:  The right schedule for you to have regular tests and exams.  Things you can do on your own to prevent diseases and keep yourself healthy. What should I know about diet, weight, and exercise? Eat a healthy diet   Eat a diet that includes plenty of vegetables, fruits, low-fat dairy products, and lean protein.  Do not eat a lot of foods that are high in solid fats, added sugars, or sodium. Maintain a healthy weight Body mass index (BMI) is used to identify weight problems. It estimates body fat based on height and weight. Your health care provider can help determine your BMI and help you achieve or maintain a healthy weight. Get regular exercise Get regular exercise. This is one of the most important things you can do for your health. Most adults should:  Exercise for at least 150 minutes each week. The exercise should increase your heart rate and make you sweat (moderate-intensity exercise).  Do strengthening exercises at least twice a week. This is in addition to the moderate-intensity exercise.  Spend less time sitting. Even light physical activity can be beneficial. Watch cholesterol and blood lipids Have your blood tested for lipids and cholesterol at 63 years of age, then have this test every 5 years. Have your cholesterol levels checked more often if:  Your lipid or cholesterol levels are high.  You  are older than 63 years of age.  You are at high risk for heart disease. What should I know about cancer screening? Depending on your health history and family history, you may need to have cancer screening at various ages. This may include screening for:  Breast cancer.  Cervical cancer.  Colorectal cancer.  Skin cancer.  Lung cancer. What should I know about heart disease, diabetes, and high blood pressure? Blood pressure and heart disease  High blood pressure causes heart disease and increases the risk of stroke. This is more likely to develop in people who have high blood pressure readings, are of African descent, or are overweight.  Have your blood pressure checked: ? Every 3-5 years if you are 54-65 years of age. ? Every year if you are 30 years old or older. Diabetes Have regular diabetes screenings. This checks your fasting blood sugar level. Have the screening done:  Once every three years after age 42 if you are at a normal weight and have a low risk for diabetes.  More often and at a younger age if you are overweight or have a high risk for diabetes. What should I know about preventing infection? Hepatitis B If you have a higher risk for hepatitis B, you should be screened for this virus. Talk with your health care provider to find out if you are at risk for hepatitis B infection. Hepatitis C Testing is recommended for:  Everyone born from [birth year] through 1965.  Anyone with known risk factors  for hepatitis C. Sexually transmitted infections (STIs)  Get screened for STIs, including gonorrhea and chlamydia, if: ? You are sexually active and are younger than 63 years of age. ? You are older than 63 years of age and your health care provider tells you that you are at risk for this type of infection. ? Your sexual activity has changed since you were last screened, and you are at increased risk for chlamydia or gonorrhea. Ask your health care provider if you are at risk.   Ask your health care provider about whether you are at high risk for HIV. Your health care provider may recommend a prescription medicine to help prevent HIV infection. If you choose to take medicine to prevent HIV, you should first get tested for HIV. You should then be tested every 3 months for as long as you are taking the medicine. Pregnancy  If you are about to stop having your period (premenopausal) and you may become pregnant, seek counseling before you get pregnant.  Take 400 to 800 micrograms (mcg) of folic acid every day if you become pregnant.  Ask for birth control (contraception) if you want to prevent pregnancy. Osteoporosis and menopause Osteoporosis is a disease in which the bones lose minerals and strength with aging. This can result in bone fractures. If you are 26 years old or older, or if you are at risk for osteoporosis and fractures, ask your health care provider if you should:  Be screened for bone loss.  Take a calcium or vitamin D supplement to lower your risk of fractures.  Be given hormone replacement therapy (HRT) to treat symptoms of menopause. Follow these instructions at home: Lifestyle  Do not use any products that contain nicotine or tobacco, such as cigarettes, e-cigarettes, and chewing tobacco. If you need help quitting, ask your health care provider.  Do not use street drugs.  Do not share needles.  Ask your health care provider for help if you need support or information about quitting drugs. Alcohol use  Do not drink alcohol if: ? Your health care provider tells you not to drink. ? You are pregnant, may be pregnant, or are planning to become pregnant.  If you drink alcohol: ? Limit how much you use to 0-1 drink a day. ? Limit intake if you are breastfeeding.  Be aware of how much alcohol is in your drink. In the [place] one drink equals one 12 oz bottle of beer (355 mL), one 5 oz glass of wine (148 mL), or one 1 oz glass of hard liquor (44  mL). General instructions  Schedule regular health, dental, and eye exams.  Stay current with your vaccines.  Tell your health care provider if: ? You often feel depressed. ? You have ever been abused or do not feel safe at home. Summary  Adopting a healthy lifestyle and getting preventive care are important in promoting health and wellness.  Follow your health care provider's instructions about healthy diet, exercising, and getting tested or screened for diseases.  Follow your health care provider's instructions on monitoring your cholesterol and blood pressure. This information is not intended to replace advice given to you by your health care provider. Make sure you discuss any questions you have with your health care provider. Document Released: 12/14/2010 Document Revised: 05/24/2018 Document Reviewed: 05/24/2018 Elsevier Patient Education  2020 [employer].

## 2019-03-14 ENCOUNTER — Other Ambulatory Visit: Payer: Self-pay | Admitting: Family Medicine

## 2019-03-14 LAB — CMP14+EGFR
ALT: 10 IU/L (ref 0–32)
AST: 23 IU/L (ref 0–40)
Albumin/Globulin Ratio: 1.8 (ref 1.2–2.2)
Albumin: 4.2 g/dL (ref 3.8–4.8)
Alkaline Phosphatase: 95 IU/L (ref 39–117)
BUN/Creatinine Ratio: 19 (ref 12–28)
BUN: 14 mg/dL (ref 8–27)
Bilirubin Total: 0.5 mg/dL (ref 0.0–1.2)
CO2: 22 mmol/L (ref 20–29)
Calcium: 9.1 mg/dL (ref 8.7–10.3)
Chloride: 105 mmol/L (ref 96–106)
Creatinine, Ser: 0.72 mg/dL (ref 0.57–1.00)
GFR calc Af Amer: 103 mL/min/{1.73_m2} (ref >59)
GFR calc non Af Amer: 89 mL/min/{1.73_m2} (ref >59)
Globulin, Total: 2.3 g/dL (ref 1.5–4.5)
Glucose: 78 mg/dL (ref 65–99)
Potassium: 4.3 mmol/L (ref 3.5–5.2)
Sodium: 140 mmol/L (ref 134–144)
Total Protein: 6.5 g/dL (ref 6.0–8.5)

## 2019-03-14 LAB — THYROID PANEL WITH TSH
Free Thyroxine Index: 2.3 (ref 1.2–4.9)
T3 Uptake Ratio: 28 % (ref 24–39)
T4, Total: 8.2 ug/dL (ref 4.5–12.0)
TSH: 0.638 u[IU]/mL (ref 0.450–4.500)

## 2019-03-14 LAB — LDL CHOLESTEROL, DIRECT: LDL Direct: 154 mg/dL — ABNORMAL HIGH (ref 0–99)

## 2019-03-14 LAB — VITAMIN D 25 HYDROXY (VIT D DEFICIENCY, FRACTURES): Vit D, 25-Hydroxy: 27.8 ng/mL — ABNORMAL LOW (ref 30.0–100.0)

## 2019-03-14 MED ORDER — LEVOTHYROXINE SODIUM 125 MCG PO TABS: 125.0000 ug | ORAL_TABLET | Freq: Every day | ORAL | 4 refills | Status: DC

## 2020-03-18 ENCOUNTER — Encounter: Payer: Self-pay | Admitting: Family Medicine

## 2020-03-18 ENCOUNTER — Other Ambulatory Visit: Payer: Self-pay

## 2020-03-18 ENCOUNTER — Ambulatory Visit (INDEPENDENT_AMBULATORY_CARE_PROVIDER_SITE_OTHER): Payer: 59 | Admitting: Family Medicine

## 2020-03-18 VITALS — BP 118/77 | HR 57 | Temp 97.4°F | Ht 66.0 in | Wt 186.0 lb

## 2020-03-18 DIAGNOSIS — Z0001 Encounter for general adult medical examination with abnormal findings: Secondary | ICD-10-CM

## 2020-03-18 DIAGNOSIS — E782 Mixed hyperlipidemia: Secondary | ICD-10-CM | POA: Diagnosis not present

## 2020-03-18 DIAGNOSIS — Z0000 Encounter for general adult medical examination without abnormal findings: Secondary | ICD-10-CM

## 2020-03-18 DIAGNOSIS — Z23 Encounter for immunization: Secondary | ICD-10-CM

## 2020-03-18 DIAGNOSIS — M8580 Other specified disorders of bone density and structure, unspecified site: Secondary | ICD-10-CM | POA: Diagnosis not present

## 2020-03-18 DIAGNOSIS — E890 Postprocedural hypothyroidism: Secondary | ICD-10-CM

## 2020-03-18 DIAGNOSIS — R718 Other abnormality of red blood cells: Secondary | ICD-10-CM

## 2020-03-18 DIAGNOSIS — E559 Vitamin D deficiency, unspecified: Secondary | ICD-10-CM

## 2020-03-18 MED ORDER — ALBUTEROL SULFATE HFA 108 (90 BASE) MCG/ACT IN AERS: 1.0000 | INHALATION_SPRAY | RESPIRATORY_TRACT | 0 refills | Status: DC | PRN

## 2020-03-18 NOTE — Progress Notes (Signed)
Misty Gomez is a 64 y.o. female presents to office today for annual physical exam examination.   Concerns today include: 1. Hypothyroidism, post ablative Patient has been compliant with her Synthroid.  No change in voice, difficulty swallowing, heart palpitation or tremor.  She has seen her eye doctor in the last 4 months.  She has known thyroid eye disease.  This is stable  2. Osteopenia/ vit D deficiency Trying to work on diet and exercise and has had successful weight loss but unfortunately regained some of his weight back recently  3. Hyperlipidemia Not currently on any cholesterol medicine.  Again had loss of weight as above but is regaining   Occupation: Retired, Substance use: None Diet: Fair  Last eye exam: Up-to-date Last dental exam: UTD Last colonoscopy: UTD Last mammogram: scheduled 04/2020 Last pap smear: 2 years ago with Dr Adah Perl Refills needed today: synthroid, albuterol (expired, not really needing) Immunizations needed: TDap  Past Medical History:  Diagnosis Date   Anemia    Arthritis    Asthma    GERD (gastroesophageal reflux disease)    Hypothyroidism    Thyroid eye disease 2017   Vitamin D deficiency    Social History   Socioeconomic History   Marital status: Married    Spouse name: Not on file   Number of children: 0   Years of education: Not on file   Highest education level: Not on file  Occupational History   Not on file  Tobacco Use   Smoking status: Never Smoker   Smokeless tobacco: Never Used  Vaping Use   Vaping Use: Never used  Substance and Sexual Activity   Alcohol use: No   Drug use: No   Sexual activity: Not Currently  Other Topics Concern   Not on file  Social History Narrative   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   She walks several times weekly.   Social Determinants of Health   Financial Resource Strain:    Difficulty  of Paying Living Expenses: Not on file  Food Insecurity:    Worried About Charity fundraiser in the Last Year: Not on file   [employer] of Food in the Last Year: Not on file  Transportation Needs:    Lack of Transportation (Medical): Not on file   Lack of Transportation (Non-Medical): Not on file  Physical Activity:    Days of Exercise per Week: Not on file   Minutes of Exercise per Session: Not on file  Stress:    Feeling of Stress : Not on file  Social Connections:    Frequency of Communication with Friends and Family: Not on file   Frequency of Social Gatherings with Friends and Family: Not on file   Attends Religious Services: Not on file   Active Member of Clubs or Organizations: Not on file   Attends Archivist Meetings: Not on file   Marital Status: Not on file  Intimate Partner Violence:    Fear of Current or Ex-Partner: Not on file   Emotionally Abused: Not on file   Physically Abused: Not on file   Sexually Abused: Not on file   Past Surgical History:  Procedure Laterality Date   BARTHOLIN GLAND CYST EXCISION     COLONOSCOPY N/A 01/06/2017   Procedure: COLONOSCOPY;  Surgeon: Rogene Houston, MD;  [facility];  Service: Endoscopy;  Laterality: N/A;  930   POLYPECTOMY  01/06/2017   Procedure: POLYPECTOMY;  Surgeon: Rogene Houston, MD;  [facility];  Service: Endoscopy;;  colon   Family History  Problem Relation Age of Onset   CVA Mother 83   Rheum arthritis Mother    Heart attack Mother 55   Heart disease Mother    Heart attack Father 72       had first heart attack in early 48s   Heart disease Father    Cancer Brother    Heart disease Brother    Cancer Brother    Diabetes Brother    Hypertension Brother    Pancreatic disease Maternal Grandmother    Diabetes Maternal Grandmother    Asthma Maternal Grandfather    Heart attack Maternal Grandfather    Heart attack Paternal Grandmother     Heart attack Paternal Grandfather     Current Outpatient Medications:    albuterol (PROVENTIL HFA;VENTOLIN HFA) 108 (90 Base) MCG/ACT inhaler, Inhale 1-2 puffs into the lungs every 4 (four) hours as needed for wheezing or shortness of breath., Disp: , Rfl:    levothyroxine (SYNTHROID) 125 MCG tablet, Take 1 tablet (125 mcg total) by mouth daily., Disp: 90 tablet, Rfl: 4   omeprazole (PRILOSEC) 20 MG capsule, Take 20 mg by mouth daily., Disp: , Rfl:   Allergies  Allergen Reactions   Relafen [Nabumetone] Rash    All over     ROS: Review of Systems A comprehensive review of systems was negative except for: Eyes: positive for contacts/glasses and visual disturbance Integument/breast: positive for dry skin on right upper lip    Physical exam BP 118/77    Pulse (!) 57    Temp (!) 97.4 F (36.3 C) (Temporal)    Ht _0  (1.676 m)    Wt 186 lb (84.4 kg)    SpO2 98%    BMI 30.02 kg/m  General appearance: alert, cooperative, appears stated age, no distress and mildly obese Head: Normocephalic, without obvious abnormality, atraumatic Eyes: negative findings: lids and lashes normal, conjunctivae and sclerae normal, corneas clear and pupils equal, round, reactive to light and accomodation Ears: normal TM's and external ear canals both ears Nose: Nares normal. Septum midline. Mucosa normal. No drainage or sinus tenderness. Throat: lips, mucosa, and tongue normal; teeth and gums normal Neck: no adenopathy, supple, symmetrical, trachea midline and thyroid not enlarged, symmetric, no tenderness/mass/nodules Back: symmetric, no curvature. ROM normal. No CVA tenderness. Lungs: clear to auscultation bilaterally Heart: regular rate and rhythm, S1, S2 normal, no murmur, click, rub or gallop Abdomen: soft, non-tender; bowel sounds normal; no masses,  no organomegaly Extremities: extremities normal, atraumatic, no cyanosis or edema Pulses: 2+ and symmetric Skin: Small dry patch of skin along the  right upper lip.  No vascular findings, exudates or rolled borders Lymph nodes: Cervical, supraclavicular, and axillary nodes normal. Neurologic: Alert and oriented X 3, normal strength and tone. Normal symmetric reflexes. Normal coordination and gait Psych: Mood stable, speech normal, affect appropriate Depression screen [facility] 2/9 03/18/2020 03/13/2019 02/24/2018  Decreased Interest 0 0 0  Down, Depressed, Hopeless 0 0 0  PHQ - 2 Score 0 0 0  Altered sleeping 0 0 -  Tired, decreased energy 0 0 -  Change in appetite 0 0 -  Feeling bad or failure about yourself  0 0 -  Trouble concentrating 0 0 -  Moving slowly or fidgety/restless 0 0 -  Suicidal thoughts 0 0 -  PHQ-9 Score 0 0 -  Assessment/ Plan: JOSI ROEDIGER here for annual physical exam.   1. Annual physical exam Release of information form completed for [place]  2. Postablative hypothyroidism Asymptomatic - Thyroid Panel With TSH  3. Mixed hyperlipidemia Check fasting lipid - CMP14+EGFR - Lipid Panel  4. Osteopenia, unspecified location Check calcium - CMP14+EGFR  5. Vitamin D deficiency Check vitamin D - VITAMIN D 25 Hydroxy (Vit-D Deficiency, Fractures)  6. Low mean corpuscular volume (MCV) - CBC with Differential   Counseled on healthy lifestyle choices, including diet (rich in fruits, vegetables and lean meats and low in salt and simple carbohydrates) and exercise (at least 30 minutes of moderate physical activity daily).  Patient to follow up in 1 year for annual exam or sooner if needed.  Edwena Mayorga M. Lajuana Ripple, DO

## 2020-03-18 NOTE — Patient Instructions (Signed)
You had labs performed today.  You will be contacted with the results of the labs once they are available, usually in the next 3 business days for routine lab work.  If you have an active my chart account, they will be released to your MyChart.  If you prefer to have these labs released to you via telephone, please let us know.  If you had a pap smear or biopsy performed, expect to be contacted in about 7-10 days.   Preventive Care 27-64 Years Old, Female Preventive care refers to visits with your health care provider and lifestyle choices that can promote health and wellness. This includes:  A yearly physical exam. This may also be called an annual well check.  Regular dental visits and eye exams.  Immunizations.  Screening for certain conditions.  Healthy lifestyle choices, such as eating a healthy diet, getting regular exercise, not using drugs or products that contain nicotine and tobacco, and limiting alcohol use. What can I expect for my preventive care visit? Physical exam Your health care provider will check your:  Height and weight. This may be used to calculate body mass index (BMI), which tells if you are at a healthy weight.  Heart rate and blood pressure.  Skin for abnormal spots. Counseling Your health care provider may ask you questions about your:  Alcohol, tobacco, and drug use.  Emotional well-being.  Home and relationship well-being.  Sexual activity.  Eating habits.  Work and work Statistician.  Method of birth control.  Menstrual cycle.  Pregnancy history. What immunizations do I need?  Influenza (flu) vaccine  This is recommended every year. Tetanus, diphtheria, and pertussis (Tdap) vaccine  You may need a Td booster every 10 years. Varicella (chickenpox) vaccine  You may need this if you have not been vaccinated. Zoster (shingles) vaccine  You may need this after age 67. Measles, mumps, and rubella (MMR) vaccine  You may need at  least one dose of MMR if you were born in 1957 or later. You may also need a second dose. Pneumococcal conjugate (PCV13) vaccine  You may need this if you have certain conditions and were not previously vaccinated. Pneumococcal polysaccharide (PPSV23) vaccine  You may need one or two doses if you smoke cigarettes or if you have certain conditions. Meningococcal conjugate (MenACWY) vaccine  You may need this if you have certain conditions. Hepatitis A vaccine  You may need this if you have certain conditions or if you travel or work in places where you may be exposed to hepatitis A. Hepatitis B vaccine  You may need this if you have certain conditions or if you travel or work in places where you may be exposed to hepatitis B. Haemophilus influenzae type b (Hib) vaccine  You may need this if you have certain conditions. Human papillomavirus (HPV) vaccine  If recommended by your health care provider, you may need three doses over 6 months. You may receive vaccines as individual doses or as more than one vaccine together in one shot (combination vaccines). Talk with your health care provider about the risks and benefits of combination vaccines. What tests do I need? Blood tests  Lipid and cholesterol levels. These may be checked every 5 years, or more frequently if you are over 27 years old.  Hepatitis C test.  Hepatitis B test. Screening  Lung cancer screening. You may have this screening every year starting at age 40 if you have a 30-pack-year history of smoking and currently smoke or  have quit within the past 15 years.  Colorectal cancer screening. All adults should have this screening starting at age 81 and continuing until age 15. Your health care provider may recommend screening at age 25 if you are at increased risk. You will have tests every 1-10 years, depending on your results and the type of screening test.  Diabetes screening. This is done by checking your blood sugar  (glucose) after you have not eaten for a while (fasting). You may have this done every 1-3 years.  Mammogram. This may be done every 1-2 years. Talk with your health care provider about when you should start having regular mammograms. This may depend on whether you have a family history of breast cancer.  BRCA-related cancer screening. This may be done if you have a family history of breast, ovarian, tubal, or peritoneal cancers.  Pelvic exam and Pap test. This may be done every 3 years starting at age 58. Starting at age 15, this may be done every 5 years if you have a Pap test in combination with an HPV test. Other tests  Sexually transmitted disease (STD) testing.  Bone density scan. This is done to screen for osteoporosis. You may have this scan if you are at high risk for osteoporosis. Follow these instructions at home: Eating and drinking  Eat a diet that includes fresh fruits and vegetables, whole grains, lean protein, and low-fat dairy.  Take vitamin and mineral supplements as recommended by your health care provider.  Do not drink alcohol if: ? Your health care provider tells you not to drink. ? You are pregnant, may be pregnant, or are planning to become pregnant.  If you drink alcohol: ? Limit how much you have to 0-1 drink a day. ? Be aware of how much alcohol is in your drink. In the [place] one drink equals one 12 oz bottle of beer (355 mL), one 5 oz glass of wine (148 mL), or one 1 oz glass of hard liquor (44 mL). Lifestyle  Take daily care of your teeth and gums.  Stay active. Exercise for at least 30 minutes on 5 or more days each week.  Do not use any products that contain nicotine or tobacco, such as cigarettes, e-cigarettes, and chewing tobacco. If you need help quitting, ask your health care provider.  If you are sexually active, practice safe sex. Use a condom or other form of birth control (contraception) in order to prevent pregnancy and STIs (sexually  transmitted infections).  If told by your health care provider, take low-dose aspirin daily starting at age 27. What's next?  Visit your health care provider once a year for a well check visit.  Ask your health care provider how often you should have your eyes and teeth checked.  Stay up to date on all vaccines. This information is not intended to replace advice given to you by your health care provider. Make sure you discuss any questions you have with your health care provider. Document Revised: 02/09/2018 Document Reviewed: 02/09/2018 Elsevier Patient Education  2020 [employer].

## 2020-03-19 ENCOUNTER — Other Ambulatory Visit: Payer: Self-pay | Admitting: Family Medicine

## 2020-03-19 LAB — CMP14+EGFR
ALT: 12 IU/L (ref 0–32)
AST: 24 IU/L (ref 0–40)
Albumin/Globulin Ratio: 1.5 (ref 1.2–2.2)
Albumin: 4 g/dL (ref 3.8–4.8)
Alkaline Phosphatase: 90 IU/L (ref 44–121)
BUN/Creatinine Ratio: 17 (ref 12–28)
BUN: 13 mg/dL (ref 8–27)
Bilirubin Total: 0.4 mg/dL (ref 0.0–1.2)
CO2: 22 mmol/L (ref 20–29)
Calcium: 9.3 mg/dL (ref 8.7–10.3)
Chloride: 106 mmol/L (ref 96–106)
Creatinine, Ser: 0.78 mg/dL (ref 0.57–1.00)
GFR calc Af Amer: 93 mL/min/{1.73_m2} (ref >59)
GFR calc non Af Amer: 81 mL/min/{1.73_m2} (ref >59)
Globulin, Total: 2.7 g/dL (ref 1.5–4.5)
Glucose: 84 mg/dL (ref 65–99)
Potassium: 4.3 mmol/L (ref 3.5–5.2)
Sodium: 141 mmol/L (ref 134–144)
Total Protein: 6.7 g/dL (ref 6.0–8.5)

## 2020-03-19 LAB — CBC WITH DIFFERENTIAL/PLATELET
Basophils Absolute: 0.1 10*3/uL (ref 0.0–0.2)
Basos: 1 %
EOS (ABSOLUTE): 0.2 10*3/uL (ref 0.0–0.4)
Eos: 4 %
Hematocrit: 37.1 % (ref 34.0–46.6)
Hemoglobin: 11.6 g/dL (ref 11.1–15.9)
Immature Grans (Abs): 0 10*3/uL (ref 0.0–0.1)
Immature Granulocytes: 0 %
Lymphocytes Absolute: 1.5 10*3/uL (ref 0.7–3.1)
Lymphs: 32 %
MCH: 25.3 pg — ABNORMAL LOW (ref 26.6–33.0)
MCHC: 31.3 g/dL — ABNORMAL LOW (ref 31.5–35.7)
MCV: 81 fL (ref 79–97)
Monocytes Absolute: 0.4 10*3/uL (ref 0.1–0.9)
Monocytes: 9 %
Neutrophils Absolute: 2.5 10*3/uL (ref 1.4–7.0)
Neutrophils: 54 %
Platelets: 229 10*3/uL (ref 150–450)
RBC: 4.58 x10E6/uL (ref 3.77–5.28)
RDW: 13.7 % (ref 11.7–15.4)
WBC: 4.7 10*3/uL (ref 3.4–10.8)

## 2020-03-19 LAB — VITAMIN D 25 HYDROXY (VIT D DEFICIENCY, FRACTURES): Vit D, 25-Hydroxy: 29 ng/mL — ABNORMAL LOW (ref 30.0–100.0)

## 2020-03-19 LAB — THYROID PANEL WITH TSH
Free Thyroxine Index: 2.3 (ref 1.2–4.9)
T3 Uptake Ratio: 28 % (ref 24–39)
T4, Total: 8.2 ug/dL (ref 4.5–12.0)
TSH: 0.841 u[IU]/mL (ref 0.450–4.500)

## 2020-03-19 LAB — LIPID PANEL
Chol/HDL Ratio: 3.7 ratio (ref 0.0–4.4)
Cholesterol, Total: 240 mg/dL — ABNORMAL HIGH (ref 100–199)
HDL: 65 mg/dL (ref >39)
LDL Chol Calc (NIH): 161 mg/dL — ABNORMAL HIGH (ref 0–99)
Triglycerides: 82 mg/dL (ref 0–149)
VLDL Cholesterol Cal: 14 mg/dL (ref 5–40)

## 2020-03-19 MED ORDER — LEVOTHYROXINE SODIUM 125 MCG PO TABS
125.0000 ug | ORAL_TABLET | Freq: Every day | ORAL | 4 refills | Status: DC
Start: 2020-03-19 — End: 2021-03-31

## 2020-03-19 NOTE — Addendum Note (Signed)
Addended byCarrolyn Leigh on: 03/19/2020 09:51 AM   Modules accepted: Orders

## 2020-09-23 ENCOUNTER — Encounter: Payer: Self-pay | Admitting: Family Medicine

## 2020-12-05 ENCOUNTER — Encounter: Payer: Self-pay | Admitting: Family Medicine

## 2020-12-09 ENCOUNTER — Other Ambulatory Visit: Payer: Self-pay

## 2020-12-09 ENCOUNTER — Ambulatory Visit: Payer: Medicare PPO | Admitting: Nurse Practitioner

## 2020-12-09 ENCOUNTER — Ambulatory Visit (INDEPENDENT_AMBULATORY_CARE_PROVIDER_SITE_OTHER): Payer: Medicare PPO

## 2020-12-09 ENCOUNTER — Encounter: Payer: Self-pay | Admitting: Nurse Practitioner

## 2020-12-09 VITALS — BP 131/77 | HR 59 | Temp 97.6°F | Resp 20 | Ht 66.0 in | Wt 189.0 lb

## 2020-12-09 DIAGNOSIS — M25551 Pain in right hip: Secondary | ICD-10-CM | POA: Diagnosis not present

## 2020-12-09 MED ORDER — PREDNISONE 20 MG PO TABS: ORAL_TABLET | ORAL | 0 refills | Status: DC

## 2020-12-09 MED ORDER — METHYLPREDNISOLONE ACETATE 40 MG/ML IJ SUSP
80.0000 mg | Freq: Once | INTRAMUSCULAR | Status: AC
  Administered 2020-12-09: 80 mg via INTRAMUSCULAR

## 2020-12-09 NOTE — Patient Instructions (Signed)
Hip Bursitis  Hip bursitis is swelling of one or more fluid-filled sacs (bursae) in your hip joint. This condition can cause pain, and your symptoms may comeand go over time. What are the causes? Repeated use of your hip muscles. Injury to the hip. Weak butt muscles. Bone spurs. Infection. In some cases, the cause may not be known. What increases the risk? You are more likely to develop this condition if: You had a past hip injury or hip surgery. You have a condition, such as arthritis, gout, diabetes, or thyroid disease. You have spine problems. You have one leg that is shorter than the other. You run a lot or do long-distance running. You play sports where there is a risk of injury or falling, such as football, martial arts, or skiing. What are the signs or symptoms? Symptoms may come and go, and they often include: Pain in the hip or groin area. Pain may get worse when you move your hip. Tenderness and swelling of the hip. In rare cases, the bursa may become infected. If this happens, you may get afever, as well as have warmth and redness in the hip area. How is this treated? This condition is treated by: Resting your hip. Icing your hip. Wrapping the hip area with an elastic bandage (compression wrap). Keeping the hip raised. Other treatments may include medicine, draining fluid out of the bursa, orusing crutches, a cane, or a walker. Surgery may be needed, but this is rare. Long-term treatment may include doing exercises to help your strength and flexibility. It may also include lifestyle changes like losing weight to lessenthe strain on your hip. Follow these instructions at home: Managing pain, stiffness, and swelling     If told, put ice on the painful area. Put ice in a plastic bag. Place a towel between your skin and the bag. Leave the ice on for 20 minutes, 2-3 times a day. Raise your hip by putting a pillow under your hips while you lie down. Stop if you feel  pain. If told, put heat on the affected area. Do this as often as told by your doctor. Use a moist heat pack or a heating pad as told by your doctor. Place a towel between your skin and the heat source. Leave the heat on for 20-30 minutes. Take off the heat if your skin turns bright red. This is very important if you are unable to feel pain, heat, or cold. You may have a greater risk of getting burned. Activity Do not use your hip to support your body weight until your doctor says that you can. Use crutches, a cane, or a walker as told by your doctor. If the affected leg is one that you use to drive, ask your doctor if it is safe to drive. Rest and protect your hip as much as you can until you feel better. Return to your normal activities as told by your doctor. Ask your doctor what activities are safe for you. Do exercises as told by your doctor. General instructions Take over-the-counter and prescription medicines only as told by your doctor. Gently rub and stretch your injured area as often as is comfortable. Wear elastic bandages only as told by your doctor. If one of your legs is shorter than the other, get fitted for a shoe insert or orthotic. Keep a healthy weight. Follow instructions from your doctor. Keep all follow-up visits as told by your doctor. This is important. How is this prevented? Exercise regularly, as told  by your doctor. Wear the right shoes for the sport you play. Warm up and stretch before being active. Cool down and stretch after being active. Take breaks often from repeated activity. Avoid activities that bother your hip or cause pain. Avoid sitting down for a long time. Where to find more information American Academy of Orthopaedic Surgeons: orthoinfo.aaos.org Contact a doctor if: You have a fever. You have new symptoms. You have trouble walking or doing everyday activities. You have pain that gets worse or does not get better with medicine. Your skin  around your hip is red. You get a feeling of warmth in your hip area. Get help right away if: You cannot move your hip. You have very bad pain. You cannot control the muscles in your feet. Summary Hip bursitis is swelling of one or more fluid-filled sacs (bursae) in your hip joint. Symptoms often come and go over time. This condition is often treated by resting and icing the hip. It also may help to keep the area raised and wrapped in an elastic bandage. Other treatments may be needed. This information is not intended to replace advice given to you by your health care provider. Make sure you discuss any questions you have with your healthcare provider. Document Revised: 04/02/2019 Document Reviewed: 02/06/2018 Elsevier Patient Education  2022 [employer].

## 2020-12-09 NOTE — Progress Notes (Signed)
   Subjective:    Patient ID: Misty Gomez, female    DOB: Nov 19, 1955, 65 y.o.   MRN: [id number]   Chief Complaint: Hip Pain (Right. Hurting for 5 weeks. Started hurting after playing pickle ball)   HPI Patinet come sin  today c/o right hip pain. Started 5 weeks ago. Was intermittent to start with. She had finished playing pickle ball and wa walking off court and felt a sudden pain in her right hip. Pain is now constant. She has tried motrin, ice, and heat. Rates pain 5/10 currently. Affects her sleep. Walking and going from sitting to standing increases pain. Rest may seem to help some.    Review of Systems  Constitutional:  Negative for diaphoresis.  Eyes:  Negative for pain.  Respiratory:  Negative for shortness of breath.   Cardiovascular:  Negative for chest pain, palpitations and leg swelling.  Gastrointestinal:  Negative for abdominal pain.  Endocrine: Negative for polydipsia.  Skin:  Negative for rash.  Neurological:  Negative for dizziness, weakness and headaches.  Hematological:  Does not bruise/bleed easily.  All other systems reviewed and are negative.     Objective:   Physical Exam Vitals and nursing note reviewed.  Constitutional:      Appearance: Normal appearance.  Cardiovascular:     Rate and Rhythm: Normal rate and regular rhythm.     Heart sounds: Normal heart sounds.  Pulmonary:     Breath sounds: Normal breath sounds.  Musculoskeletal:     Comments: pAtient standing in exam room because it hurts to go from sitting to standing. Pain on rotation of hip inward and outward Motor strength and sensation distally intact  Neurological:     Mental Status: She is alert.   BP 131/77   Pulse (!) 59   Temp 97.6 F (36.4 C) (Temporal)   Resp 20   Ht 5\' 6"  (1.676 m)   Wt 189 lb (85.7 kg)   SpO2 97%   BMI 30.51 kg/m    Xray right hip- osteoarthritis- Preliminary reading by Ronnald Collum, FNP  [facility]       Assessment & Plan:  [name] in today  with chief complaint of Hip Pain (Right. Hurting for 5 weeks. Started hurting after playing pickle ball)   1. Right hip pain Moist heat  Rest RTO if does not improve - DG HIP UNILAT W OR W/O PELVIS 2-3 VIEWS RIGHT - methylPREDNISolone acetate (DEPO-MEDROL) injection 80 mg - predniSONE (DELTASONE) 20 MG tablet; 2 po at sametime daily for 5 days-  Dispense: 10 tablet; Refill: 0    The above assessment and management plan was discussed with the patient. The patient verbalized understanding of and has agreed to the management plan. Patient is aware to call the clinic if symptoms persist or worsen. Patient is aware when to return to the clinic for a follow-up visit. Patient educated on when it is appropriate to go to the emergency department.   Mary-Margaret Hassell Done, FNP

## 2020-12-16 ENCOUNTER — Other Ambulatory Visit: Payer: Self-pay | Admitting: Nurse Practitioner

## 2020-12-16 DIAGNOSIS — M25551 Pain in right hip: Secondary | ICD-10-CM

## 2020-12-16 NOTE — Progress Notes (Signed)
.  orth

## 2021-01-14 DIAGNOSIS — M25551 Pain in right hip: Secondary | ICD-10-CM | POA: Diagnosis not present

## 2021-01-29 ENCOUNTER — Encounter: Payer: Self-pay | Admitting: *Deleted

## 2021-02-02 DIAGNOSIS — M1611 Unilateral primary osteoarthritis, right hip: Secondary | ICD-10-CM | POA: Diagnosis not present

## 2021-02-02 DIAGNOSIS — M7061 Trochanteric bursitis, right hip: Secondary | ICD-10-CM | POA: Diagnosis not present

## 2021-02-09 ENCOUNTER — Other Ambulatory Visit: Payer: Self-pay

## 2021-02-09 ENCOUNTER — Encounter: Payer: Self-pay | Admitting: Family Medicine

## 2021-02-09 ENCOUNTER — Ambulatory Visit (INDEPENDENT_AMBULATORY_CARE_PROVIDER_SITE_OTHER): Payer: Medicare PPO

## 2021-02-09 ENCOUNTER — Ambulatory Visit: Payer: Medicare PPO | Admitting: Family Medicine

## 2021-02-09 VITALS — BP 132/88 | HR 76 | Temp 97.5°F | Ht 66.0 in | Wt 189.2 lb

## 2021-02-09 DIAGNOSIS — G8929 Other chronic pain: Secondary | ICD-10-CM

## 2021-02-09 DIAGNOSIS — M5441 Lumbago with sciatica, right side: Secondary | ICD-10-CM

## 2021-02-09 DIAGNOSIS — M545 Low back pain, unspecified: Secondary | ICD-10-CM | POA: Diagnosis not present

## 2021-02-09 DIAGNOSIS — M25551 Pain in right hip: Secondary | ICD-10-CM | POA: Diagnosis not present

## 2021-02-09 MED ORDER — KETOROLAC TROMETHAMINE 30 MG/ML IJ SOLN
30.0000 mg | Freq: Once | INTRAMUSCULAR | Status: AC
  Administered 2021-02-09: 30 mg via INTRAMUSCULAR

## 2021-02-09 MED ORDER — HYDROCODONE-ACETAMINOPHEN 5-325 MG PO TABS: 1.0000 | ORAL_TABLET | Freq: Four times a day (QID) | ORAL | 0 refills | Status: AC | PRN

## 2021-02-09 MED ORDER — METHOCARBAMOL 500 MG PO TABS: 250.0000 mg | ORAL_TABLET | Freq: Four times a day (QID) | ORAL | 0 refills | Status: DC | PRN

## 2021-02-09 NOTE — Progress Notes (Signed)
Subjective: CC: Back/hip pain PCP: Janora Norlander, DO [id number] Misty Gomez is a 65 y.o. female presenting to clinic today for:  1.  Back/hip pain Patient reports exquisite right-sided back and hip pain that onset last Thursday.  She saw her orthopedist that Monday previous and had a injection performed.  She has been seeing him for hip bursitis but notes that her most recent injection was "between the joints".  She has not reached out to him about this pain but notes that this has been getting worse such that she could not walk at 1 point.  She denies any sensory changes, changes in her urine output or bowel movements.   ROS: Per HPI  Allergies  Allergen Reactions   Relafen [Nabumetone] Rash    All over   Past Medical History:  Diagnosis Date   Anemia    Arthritis    Asthma    GERD (gastroesophageal reflux disease)    Hypothyroidism    Thyroid eye disease 2017   Vitamin Misty deficiency     Current Outpatient Medications:    albuterol (VENTOLIN HFA) 108 (90 Base) MCG/ACT inhaler, Inhale 1-2 puffs into the lungs every 4 (four) hours as needed for wheezing or shortness of breath., Disp: 1 each, Rfl: 0   levothyroxine (SYNTHROID) 125 MCG tablet, Take 1 tablet (125 mcg total) by mouth daily., Disp: 90 tablet, Rfl: 4   omeprazole (PRILOSEC) 20 MG capsule, Take 20 mg by mouth daily., Disp: , Rfl:  Social History   Socioeconomic History   Marital status: Married    Spouse name: Not on file   Number of children: 0   Years of education: Not on file   Highest education level: Not on file  Occupational History   Not on file  Tobacco Use   Smoking status: Never   Smokeless tobacco: Never  Vaping Use   Vaping Use: Never used  Substance and Sexual Activity   Alcohol use: No   Drug use: No   Sexual activity: Not Currently  Other Topics Concern   Not on file  Social History Narrative   Ms. 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  She lives at home  with her husband.   She walks several times weekly.   Social Determinants of Health   Financial Resource Strain: Not on file  Food Insecurity: Not on file  Transportation Needs: Not on file  Physical Activity: Not on file  Stress: Not on file  Social Connections: Not on file  Intimate Partner Violence: Not on file   Family History  Problem Relation Age of Onset   CVA Mother 58   Rheum arthritis Mother    Heart attack Mother 29   Heart disease Mother    Heart attack Father 73       had first heart attack in early 61s   Heart disease Father    Cancer Brother    Heart disease Brother    Cancer Brother    Diabetes Brother    Hypertension Brother    Pancreatic disease Maternal Grandmother    Diabetes Maternal Grandmother    Asthma Maternal Grandfather    Heart attack Maternal Grandfather    Heart attack Paternal Grandmother    Heart attack Paternal Grandfather     Objective: Office vital signs reviewed. BP 132/88   Pulse 76   Temp (!) 97.5 F (36.4 C)   Ht '5\' 6"'$  (1.676 m)   Wt 189 lb  3.2 oz (85.8 kg)   SpO2 99%   BMI 30.54 kg/m   Physical Examination:  General: Awake, alert, very uncomfortable appearing MSK: Extremely antalgic gait and station.  She is ambulating independently.   Lumbar spine: No midline tenderness palpation.  She has exquisite tenderness palpation with light touch to the right paraspinal muscles just over the lumbosacral region and into the sacroiliac region.  Assessment/ Plan: 65 y.o. female   Chronic right-sided low back pain without sciatica - Plan: methocarbamol (ROBAXIN) 500 MG tablet, HYDROcodone-acetaminophen (NORCO) 5-325 MG tablet, DG Lumbar Spine 2-3 Views, ketorolac (TORADOL) 30 MG/ML injection 30 mg  Chronic right hip pain - Plan: methocarbamol (ROBAXIN) 500 MG tablet, HYDROcodone-acetaminophen (NORCO) 5-325 MG tablet, DG Lumbar Spine 2-3 Views, ketorolac (TORADOL) 30 MG/ML injection 30 mg  She is in fairly severe pain and was  exquisitely tender to palpation on exam.  I reviewed her x-rays which did not show any gross disc abnormalities but did have some mild degenerative changes within the vertebral segments themselves.  I do worry about this reflecting the hip pain that she is being treated for by orthopedics.  I tried reaching out to their office but unfortunately her specialist was in surgery and his associates were not available for consultation.  A message was left and I am awaiting call back so that we can determine whether or not to proceed with MRI of her hip versus her low back.  I favor her low back given the tenderness that was noted over the lumbosacral and sacroiliac regions but I suspect that this could present as a referred pain and spasm secondary to chronic hip issues.  She had no radicular symptoms and certainly no red flag signs or symptoms.  She was given a Toradol injection, there were no immediate complications (she has a history of rash with nabumetone).  She was placed on a muscle relaxer and given oral Norco for as needed use.  National narcotic database was reviewed and there were no red flags.  No orders of the defined types were placed in this encounter.  No orders of the defined types were placed in this encounter.    Janora Norlander, DO [place] [phone]

## 2021-02-10 ENCOUNTER — Other Ambulatory Visit: Payer: Self-pay | Admitting: Family Medicine

## 2021-02-10 ENCOUNTER — Encounter: Payer: Self-pay | Admitting: Family Medicine

## 2021-02-10 DIAGNOSIS — G8929 Other chronic pain: Secondary | ICD-10-CM

## 2021-02-10 NOTE — Progress Notes (Signed)
She had a good BM today.  She has been utilizing something to help her go to the bathroom.    She continues to have quite a severe degree of pain ongoing.  She does report feeling weak in the right lower extremity.  This is despite use of Robaxin, Norco.  Her orthopedist office reached out to me today and agree that MRI of the lumbar spine would be appropriate.  Unfortunately they are booking out for 3 weeks for MRIs there.  Patient would prefer to have MRI at locally because pain is so bad that she does not feel that she could sit long enough to drive to [facility] for imaging or evaluation at this point.  Orders Placed This Encounter  Procedures   MR Lumbar Spine Wo Contrast    Standing Status:   Future    Standing Expiration Date:   02/10/2022    Order Specific Question:   What is the patient's sedation requirement?    Answer:   No Sedation    Order Specific Question:   Does the patient have a pacemaker or implanted devices?    Answer:   No    Order Specific Question:   Preferred imaging location?    Answer:   [facility] (table limit - 550lbs)

## 2021-02-10 NOTE — Telephone Encounter (Signed)
Already spoke to her 

## 2021-02-12 ENCOUNTER — Encounter: Payer: Self-pay | Admitting: Family Medicine

## 2021-02-19 ENCOUNTER — Other Ambulatory Visit: Payer: Self-pay

## 2021-02-19 ENCOUNTER — Ambulatory Visit (HOSPITAL_COMMUNITY)
Admission: RE | Admit: 2021-02-19 | Discharge: 2021-02-19 | Disposition: A | Discharge status: Home / Self Care | Payer: Medicare PPO | Source: Ambulatory Visit | Attending: Family Medicine | Admitting: Family Medicine

## 2021-02-19 DIAGNOSIS — G8929 Other chronic pain: Secondary | ICD-10-CM | POA: Diagnosis not present

## 2021-02-19 DIAGNOSIS — M545 Low back pain, unspecified: Secondary | ICD-10-CM | POA: Diagnosis not present

## 2021-02-19 DIAGNOSIS — M25551 Pain in right hip: Secondary | ICD-10-CM | POA: Insufficient documentation

## 2021-02-20 ENCOUNTER — Other Ambulatory Visit: Payer: Self-pay

## 2021-02-20 ENCOUNTER — Encounter: Payer: Self-pay | Admitting: Family Medicine

## 2021-02-20 DIAGNOSIS — M25551 Pain in right hip: Secondary | ICD-10-CM

## 2021-02-20 DIAGNOSIS — G8929 Other chronic pain: Secondary | ICD-10-CM

## 2021-02-22 ENCOUNTER — Encounter: Payer: Self-pay | Admitting: Family Medicine

## 2021-02-23 ENCOUNTER — Other Ambulatory Visit: Payer: Self-pay | Admitting: Family Medicine

## 2021-03-04 ENCOUNTER — Ambulatory Visit: Payer: Medicare PPO | Attending: Family Medicine | Admitting: Physical Therapy

## 2021-03-04 ENCOUNTER — Other Ambulatory Visit: Payer: Self-pay

## 2021-03-04 ENCOUNTER — Encounter: Payer: Self-pay | Admitting: Physical Therapy

## 2021-03-04 DIAGNOSIS — M25551 Pain in right hip: Secondary | ICD-10-CM | POA: Insufficient documentation

## 2021-03-04 DIAGNOSIS — M545 Low back pain, unspecified: Secondary | ICD-10-CM | POA: Insufficient documentation

## 2021-03-04 NOTE — Therapy (Signed)
[facility] [place], [place], [address] Phone: [phone]   Fax:  [phone]  Physical Therapy Evaluation  Patient Details  Name: Misty Gomez MRN: [id number] Date of Birth: [birth year] Referring Provider (PT): Ronnie Doss DO   Encounter Date: 03/04/2021   PT End of Session - 03/04/21 1006     Visit Number 1    Number of Visits 12    Date for PT Re-Evaluation 04/15/21    PT Start Time 0858    PT Stop Time 0948    PT Time Calculation (min) 50 min    Activity Tolerance Patient tolerated treatment well    Behavior During Therapy [facility] for tasks assessed/performed             Past Medical History:  Diagnosis Date   Anemia    Arthritis    Asthma    GERD (gastroesophageal reflux disease)    Hypothyroidism    Thyroid eye disease 2017   Vitamin D deficiency     Past Surgical History:  Procedure Laterality Date   BARTHOLIN GLAND CYST EXCISION     COLONOSCOPY N/A 01/06/2017   Procedure: COLONOSCOPY;  Surgeon: Rogene Houston, MD;  [facility];  Service: Endoscopy;  Laterality: N/A;  930   POLYPECTOMY  01/06/2017   Procedure: POLYPECTOMY;  Surgeon: Rogene Houston, MD;  [facility]: [facility];  Service: Endoscopy;;  colon    There were no vitals filed for this visit.    Subjective Assessment - 03/04/21 1025     Subjective COVID-19 screen performed prior to patient entering clinic.  The patient presents to the clinic today with c/o right hip pain.  She reports this came on suddenly and severely in June of this year.  It was an intense stabbing pain after playing pickle ball.  She was put on Prednisone which helped for about about 5 days.  She has alos had an Orthopedic consult and has had multiple hip injection, including "in" the joint via Dr. Nelva Bush.  None of these have provided long-term pain relief.  In fact, during her evaluation she was visibly uncomfortable having to weight shift off her  right buttock while sitting and standing periodically.  Her pain today at rest is a 4/10 but can rise to severe levels  when in prolonged positions and certain movements.  Repositioning, ice, heat and massage can help decrease pain.  She also reported a 3 to 4 week period that her low back was in spasm.  Massage was helpful for this.    Pertinent History OA, Hypothyroidism, osteopenia.    Limitations Sitting;Standing;Walking;House hold activities;Other (comment)    How long can you sit comfortably? She is okay if she sits perfectly still.    How long can you stand comfortably? 10 minutes or less.    How long can you walk comfortably? Tried to walk a half mile but the pain was too much.  Prior to the onset of her right hip pain she walked 3-4 miles per day and played Pickleball and cleaned house.    Diagnostic tests X-rays, MRI....please see under "Imaging" tab.    Patient Stated Goals Get back to former lifestyle.    Currently in Pain? Yes    Pain Score 4     Pain Location Hip   Ache in back.   Pain Orientation Right    Pain Descriptors / Indicators Aching;Burning    Pain Type Acute pain    Pain Onset More  than a month ago    Pain Frequency Constant    Aggravating Factors  Please see above.    Pain Relieving Factors Please see above.    Effect of Pain on Daily Activities Please see above.                [facility] PT Assessment - 03/04/21 0001       Assessment   Medical Diagnosis Right-sided low back pain.  Right hip pain.    Referring Provider (PT) Ronnie Doss DO    Onset Date/Surgical Date --   June of 2022.     Precautions   Precautions None      Restrictions   Weight Bearing Restrictions No      Balance Screen   Has the patient fallen in the past 6 months No    Has the patient had a decrease in activity level because of a fear of falling?  No    Is the patient reluctant to leave their home because of a fear of falling?  No      Home Film/video editor residence      Prior Function   Level of Independence Independent    Leisure [place].      Posture/Postural Control   Posture/Postural Control No significant limitations    Posture Comments Patient's posture is generally quite good.  She does require weight shifting off her right LE due to pain.      Deep Tendon Reflexes   DTR Assessment Site Patella;Achilles    Patella DTR 2+    Achilles DTR 2+      ROM / Strength   AROM / PROM / Strength AROM;Strength      AROM   Overall AROM Comments Lumbar flexion performed slowly through a full range of motion.  Active lumbar extension is full and decrease pain.  Right hip could be passively ranged fully but done slow due to pain anticipation.      Strength   Overall Strength Comments LE strength is normal.      Palpation   Palpation comment Very tender to palpation over patient's right SIJ and also very tender over her right greater trochanter.      Special Tests   Other special tests Right hip manula femoral traction decreased pain.  (-) SLR test.  Pain reproduction with a FABER test.  No significant pain reproduction wiht a FADIR and Hip Scour test. Right  LE < a 1/2 shorter than left due to a left pelvic rotation which was corrected with a reveres muscle energy technique.      Ambulation/Gait   Gait Comments Antalgic gait with decreased stance time over her right LE.                        Objective measurements completed on examination: See above findings.       [facility] Adult PT Treatment/Exercise - 03/04/21 0001       Modalities   Modalities Ultrasound      Ultrasound   Ultrasound Location Right lateral hip.    Ultrasound Parameters Combo e'stim/US at 1.50 W/CM2 x 8 minutes.    Ultrasound Goals Pain                     PT Education - 03/04/21 1049     Education Details Adductor Squeeze.  Resisted right hip flexion using right hnad.    Person(s) Educated Patient;Spouse  Methods  Explanation;Demonstration    Comprehension Verbalized understanding;Returned demonstration                 PT Long Term Goals - 03/04/21 1107       PT LONG TERM GOAL #1   Title Independent with a HEP.    Time 6    Period Weeks    Status New      PT LONG TERM GOAL #2   Title Perform ADL's with pain not > 2-3/10.    Time 6    Period Weeks    Status New      PT LONG TERM GOAL #3   Title Walk one mile with pain not > 2/10.    Time 6    Period Weeks    Status New      PT LONG TERM GOAL #4   Title Return to playing Pickleball.    Time 6    Period Weeks    Status New      PT LONG TERM GOAL #5   Title Normal gait pattern.    Time 6    Period Weeks    Status New      PT LONG TERM GOAL #6   Title Stand 30 minutes wiht pain not > 2-3/10.    Time 6    Period Weeks    Status New                    Plan - 03/04/21 1055     Clinical Impression Statement The patient presents to OPPT with ongoing right hip pain since June of this year.  The pain can become very sever at times.  Prioe to the onset of pain she lead a very active lifestyle and hopes to return to that.  She was very tender to palpation over her right SIJ and greater trochater.  Her active lumbar flexion and extension is normal and extension makes her back feel better.  Her passive right hip range of motion is full become needed to be done gently due to pain anticipation.  She demonstrated a right pelvic posterior rotation.  She has pain reproduction wiht a right FABER test.  She has difficulty staying in one position for an extended period of time due to pain.  Her Le DTR's are normal.  Her functional mobility is impaired due to pain. Patient will benefit from skilled physical therapy intervention to address pain and deficits.    Personal Factors and Comorbidities Comorbidity 1;Other    Comorbidities OA, Hypothyroidism, osteopenia.    Examination-Activity Limitations Sit;Stand;Other;Locomotion Level     Examination-Participation Restrictions Cleaning;Other    Stability/Clinical Decision Making Evolving/Moderate complexity    Clinical Decision Making Moderate    Rehab Potential Good    PT Frequency 2x / week    PT Duration 6 weeks    PT Treatment/Interventions ADLs/Self Care Home Management;Cryotherapy;Electrical Stimulation;Ultrasound;Traction;Moist Heat;Iontophoresis 4mg /ml Dexamethasone;Functional mobility training;Therapeutic exercise;Manual techniques;Patient/family education;Passive range of motion;Dry needling;Joint Manipulations;Other (comment)    PT Next Visit Plan FOTO......Marland KitchenDry Needling, in left sdly position with pillow between knees.Marland KitchenMarland KitchenCombo e'stim/US to right SIJ and right lateral hip f/b STW/M, Iontophoresis to right greater trochanter region.  Recumbent bike.  McKenzie extension exercise for lumbar region.  "Clamshells"; sdly hip abduction.ay consider trying intermittment lumbar traction beginning at 35% bodyweight.    Consulted and Agree with Plan of Care Patient   Husband, Jenny Reichmann, present.            Patient will benefit from  skilled therapeutic intervention in order to improve the following deficits and impairments:  Abnormal gait, Difficulty walking, Decreased activity tolerance, Pain  Visit Diagnosis: Pain in right hip - Plan: PT plan of care cert/re-cert  Acute right-sided low back pain without sciatica - Plan: PT plan of care cert/re-cert     Problem List Patient Active Problem List   Diagnosis Date Noted   Vitamin D deficiency 02/24/2018   Anemia 02/24/2018   Postablative hypothyroidism 02/24/2018   Osteopenia 02/24/2018   Special screening for malignant neoplasms, colon 09/09/2016   Other reasons for seeking consultation 12/06/2013    Mataeo Ingwersen, [place], PT 03/04/2021, 11:12 AM  [facility] [address] [place], [place], [address] Phone: [phone]   Fax:  [phone]  Name: Misty Gomez MRN:  [id number] Date of Birth: 01-14-1956

## 2021-03-09 ENCOUNTER — Encounter: Payer: Self-pay | Admitting: Physical Therapy

## 2021-03-09 ENCOUNTER — Other Ambulatory Visit: Payer: Self-pay

## 2021-03-09 ENCOUNTER — Ambulatory Visit: Payer: Medicare PPO | Admitting: Physical Therapy

## 2021-03-09 DIAGNOSIS — M25551 Pain in right hip: Secondary | ICD-10-CM | POA: Diagnosis not present

## 2021-03-09 DIAGNOSIS — M545 Low back pain, unspecified: Secondary | ICD-10-CM | POA: Diagnosis not present

## 2021-03-09 NOTE — Patient Instructions (Signed)
[facility]   DRY NEEDLING CONSENT FORM   Trigger point dry needling is a physical therapy approach to treat Myofascial Pain and Dysfunction.  Dry Needling (DN) is a valuable and effective way to deactivate myofascial trigger points (muscle knots/pain). It is skilled intervention that uses a thin filiform needle to penetrate the skin and stimulate underlying myofascial trigger points, muscular, and connective tissues for the management of neuromusculoskeletal pain and movement impairments.  A local twitch response (LTR) will be elicited.  This can sometimes feel like a deep ache in the muscle during the procedure. Multiple trigger points in multiple muscles can be treated during each treatment.  No medication of any kind is injected.   As with any medical treatment and procedure, there are possible adverse events.  While significant adverse events are uncommon, they do sometimes occur and must be considered prior to giving consent.  Dry needling often causes a "post needling soreness".  There can be an increase in pain from a couple of hours to 2-3 days, followed by an improvement in the overall pain state. Any time a needle is used there is a risk of infection.  However, we are using new, sterile, and disposable needles; infections are extremely rare. There is a possibility that you may bleed or bruise.  You may feel tired and some nausea following treatment. There is a rare possibility of a pneumothorax (air in the chest cavity). Allergic reaction to nickel in the stainless steel needle. If a nerve is touched, it may cause paresthesia (a prickling/shock sensation) which is usually brief, but may continue for a couple of days.  Following treatment stay hydrated.  Continue regular activities but not too vigorous initially after treatment for 24-48 hours.  Dry Needling is best when combined with other physical therapy interventions such as strengthening, stretching  and other therapeutic modalities.   PLEASE ANSWER THE FOLLOWING QUESTIONS:  Do you have a lack of sensation?   Y/N  Do you have a phobia or fear of needles  Y/N  Are you pregnant?    Y/N If yes:  How many weeks? __________ Do you have any implanted devices?  Y/N If yes:  Pacemaker/Spinal Cord Stimulator/Deep Brain Stimulator/Insulin Pump/Other: ________________ Do you have any implants?  Y/N If yes: Breast/Facial/Pecs/Buttocks/Calves/Hip  Replacement/ Knee Replacement/Other: _________ Do you take any blood thinners?   Y/N If yes: Coumadin (Warfarin)/Other: ___________________ Do you have a bleeding disorder?   Y/N If yes: What kind: _________________________________ Do you take any immunosuppressants?  Y/N If yes:   What kind: _________________________________ Do you take anti-inflammatories?   Y/N If yes: What kind: Advil/Aspirin/Other: ________________ Have you ever been diagnosed with Scoliosis? Y/N Have you had back surgery?   Y/N If yes:  Laminectomy/Fusion/Other: ___________________   I have read, or had read to me, the above.  I have had the opportunity to ask any questions.  All of my questions have been answered to my satisfaction and I understand the risks involved with dry needling.  I consent to examination and treatment at [facility], including dry needling, of any and all of my involved and affected muscles.     Signature: __________________________________     Date:___________________________________________________     

## 2021-03-09 NOTE — Therapy (Signed)
[facility] [place], [place], [address] Phone: [phone]   Fax:  [phone]  Physical Therapy Treatment  Patient Details  Name: Misty Gomez MRN: [id number] Date of Birth: 1955/12/27 Referring Provider (PT): Ronnie Doss DO   Encounter Date: 03/09/2021   PT End of Session - 03/09/21 1029     Visit Number 2    Number of Visits 12    Date for PT Re-Evaluation 04/15/21    PT Start Time 0955    PT Stop Time 1048    PT Time Calculation (min) 53 min    Activity Tolerance Patient tolerated treatment well    Behavior During Therapy [facility] for tasks assessed/performed             Past Medical History:  Diagnosis Date   Anemia    Arthritis    Asthma    GERD (gastroesophageal reflux disease)    Hypothyroidism    Thyroid eye disease 2017   Vitamin D deficiency     Past Surgical History:  Procedure Laterality Date   BARTHOLIN GLAND CYST EXCISION     COLONOSCOPY N/A 01/06/2017   Procedure: COLONOSCOPY;  Surgeon: Rogene Houston, MD;  [facility];  Service: Endoscopy;  Laterality: N/A;  930   POLYPECTOMY  01/06/2017   Procedure: POLYPECTOMY;  Surgeon: Rogene Houston, MD;  [facility];  Service: Endoscopy;;  colon    There were no vitals filed for this visit.   Subjective Assessment - 03/09/21 1025     Subjective COVID-19 screen performed prior to patient entering clinic.  About the same.  Saturday was okay but yesterday was rough.  Patient had to lie on an ice pack.    Pertinent History OA, Hypothyroidism, osteopenia.    Limitations Sitting;Standing;Walking;House hold activities;Other (comment)    How long can you sit comfortably? She is okay if she sits perfectly still.    How long can you stand comfortably? 10 minutes or less.    How long can you walk comfortably? Tried to walk a half mile but the pain was too much.  Prior to the onset of her right hip pain she walked 3-4 miles per  day and played Pickleball and cleaned house.    Diagnostic tests X-rays, MRI....please see under "Imaging" tab.    Patient Stated Goals Get back to former lifestyle.    Currently in Pain? Yes    Pain Score 4     Pain Location Hip    Pain Orientation Right    Pain Descriptors / Indicators Aching;Burning    Pain Onset More than a month ago                               [facility] Adult PT Treatment/Exercise - 03/09/21 0001       Modalities   Modalities Electrical Stimulation;Moist Heat;Ultrasound;Iontophoresis      Moist Heat Therapy   Number Minutes Moist Heat 20 Minutes    Moist Heat Location Lumbar Spine      Electrical Stimulation   Electrical Stimulation Location Right SIJ/hip.    Electrical Stimulation Action IFC at 80-150 Hz.    Electrical Stimulation Parameters 40% scan x 20 minutes.    Electrical Stimulation Goals Pain;Tone      Ultrasound   Ultrasound Location Right lateral hip    Ultrasound Parameters Combo e'stim/US at 1.50 W/CM2 x 8 minutes.    Ultrasound Goals  Pain      Iontophoresis   Type of Iontophoresis Dexamethasone    Location Right grater trochanter.    Dose 80 mA-Min.    Time 8.      Manual Therapy   Manual Therapy Soft tissue mobilization    Soft tissue mobilization STW/M x 8 minutes to patient's right lateral hip musculature to decrease pain and tone.                          PT Long Term Goals - 03/04/21 1107       PT LONG TERM GOAL #1   Title Independent with a HEP.    Time 6    Period Weeks    Status New      PT LONG TERM GOAL #2   Title Perform ADL's with pain not > 2-3/10.    Time 6    Period Weeks    Status New      PT LONG TERM GOAL #3   Title Walk one mile with pain not > 2/10.    Time 6    Period Weeks    Status New      PT LONG TERM GOAL #4   Title Return to playing Pickleball.    Time 6    Period Weeks    Status New      PT LONG TERM GOAL #5   Title Normal gait pattern.    Time 6     Period Weeks    Status New      PT LONG TERM GOAL #6   Title Stand 30 minutes wiht pain not > 2-3/10.    Time 6    Period Weeks    Status New                   Plan - 03/09/21 1036     Clinical Impression Statement Patient very tender to palpation around her right lateral hip muscualture and SIJ.  Patient provided with dry needling consent form with plan to perform at her next visit.    Personal Factors and Comorbidities Comorbidity 1;Other    Comorbidities OA, Hypothyroidism, osteopenia.    Examination-Activity Limitations Sit;Stand;Other;Locomotion Level    Examination-Participation Restrictions Cleaning;Other    Rehab Potential Good    PT Frequency 2x / week    PT Duration 6 weeks    PT Treatment/Interventions ADLs/Self Care Home Management;Cryotherapy;Electrical Stimulation;Ultrasound;Traction;Moist Heat;Iontophoresis 4mg /ml Dexamethasone;Functional mobility training;Therapeutic exercise;Manual techniques;Patient/family education;Passive range of motion;Dry needling;Joint Manipulations;Other (comment)    PT Next Visit Plan FOTO......Marland KitchenDry Needling, in left sdly position with pillow between knees.Marland KitchenMarland KitchenCombo e'stim/US to right SIJ and right lateral hip f/b STW/M, Iontophoresis to right greater trochanter region.  Recumbent bike.  McKenzie extension exercise for lumbar region.  "Clamshells"; sdly hip abduction.ay consider trying intermittment lumbar traction beginning at 35% bodyweight.    Consulted and Agree with Plan of Care Patient             Patient will benefit from skilled therapeutic intervention in order to improve the following deficits and impairments:  Abnormal gait, Difficulty walking, Decreased activity tolerance, Pain  Visit Diagnosis: Pain in right hip  Acute right-sided low back pain without sciatica     Problem List Patient Active Problem List   Diagnosis Date Noted   Vitamin D deficiency 02/24/2018   Anemia 02/24/2018   Postablative  hypothyroidism 02/24/2018   Osteopenia 02/24/2018   Special screening for malignant neoplasms, colon 09/09/2016  Other reasons for seeking consultation 12/06/2013    Naeema Patlan, [place], PT 03/09/2021, 11:02 AM  [facility] [address] [place], [place], [address] Phone: [phone]   Fax:  [phone]  Name: ELDENE PLOCHER MRN: [id number] Date of Birth: 04/25/1956

## 2021-03-12 ENCOUNTER — Encounter: Payer: Self-pay | Admitting: Physical Therapy

## 2021-03-12 ENCOUNTER — Ambulatory Visit: Payer: Medicare PPO | Admitting: Physical Therapy

## 2021-03-12 ENCOUNTER — Other Ambulatory Visit: Payer: Self-pay

## 2021-03-12 DIAGNOSIS — M25551 Pain in right hip: Secondary | ICD-10-CM | POA: Diagnosis not present

## 2021-03-12 DIAGNOSIS — M545 Low back pain, unspecified: Secondary | ICD-10-CM | POA: Diagnosis not present

## 2021-03-12 NOTE — Therapy (Signed)
[facility] [place], [place], [address] Phone: [phone]   Fax:  [phone]  Physical Therapy Treatment  Patient Details  Name: Misty Gomez MRN: [id number] Date of Birth: [birth year] Referring Provider (PT): Ronnie Doss DO   Encounter Date: 03/12/2021   PT End of Session - 03/12/21 1033     Visit Number 3    Number of Visits 12    Date for PT Re-Evaluation 04/15/21    PT Start Time 1033    PT Stop Time 1120    PT Time Calculation (min) 47 min    Activity Tolerance Patient tolerated treatment well    Behavior During Therapy [facility] for tasks assessed/performed             Past Medical History:  Diagnosis Date   Anemia    Arthritis    Asthma    GERD (gastroesophageal reflux disease)    Hypothyroidism    Thyroid eye disease 2017   Vitamin D deficiency     Past Surgical History:  Procedure Laterality Date   BARTHOLIN GLAND CYST EXCISION     COLONOSCOPY N/A 01/06/2017   Procedure: COLONOSCOPY;  Surgeon: Rogene Houston, MD;  [facility];  Service: Endoscopy;  Laterality: N/A;  930   POLYPECTOMY  01/06/2017   Procedure: POLYPECTOMY;  Surgeon: Rogene Houston, MD;  [facility];  Service: Endoscopy;;  colon    There were no vitals filed for this visit.   Subjective Assessment - 03/12/21 1031     Subjective COVID-19 screen performed prior to patient entering clinic. Reports that almost instant pain with walking on concrete floors. Pain is overall better.    Patient is accompained by: Family member   Husband   Pertinent History OA, Hypothyroidism, osteopenia.    Limitations Sitting;Standing;Walking;House hold activities;Other (comment)    How long can you sit comfortably? She is okay if she sits perfectly still.    How long can you stand comfortably? 10 minutes or less.    How long can you walk comfortably? Tried to walk a half mile but the pain was too much.  Prior to the onset  of her right hip pain she walked 3-4 miles per day and played Pickleball and cleaned house.    Diagnostic tests X-rays, MRI....please see under "Imaging" tab.    Patient Stated Goals Get back to former lifestyle.    Currently in Pain? Yes    Pain Score 3     Pain Location Hip    Pain Orientation Right    Pain Descriptors / Indicators Discomfort    Pain Type Acute pain    Pain Onset More than a month ago    Pain Frequency Constant                [facility] PT Assessment - 03/12/21 0001       Assessment   Medical Diagnosis Right-sided low back pain.  Right hip pain.    Referring Provider (PT) Ronnie Doss DO      Precautions   Precautions None      Restrictions   Weight Bearing Restrictions No                           [facility] Adult PT Treatment/Exercise - 03/12/21 0001       Modalities   Modalities Electrical Stimulation;Iontophoresis;Ultrasound      Theme park manager R greater  trochanter    Electrical Stimulation Action IFC    Electrical Stimulation Parameters 80-150 hz x10 min    Electrical Stimulation Goals Pain;Tone      Ultrasound   Ultrasound Location R lateral hip    Ultrasound Parameters Combo 1.5 w/cm2, 100%, 1 mhz x10 min    Ultrasound Goals Pain      Iontophoresis   Type of Iontophoresis Dexamethasone    Location Right grater trochanter.    Dose 80 mA-Min.    Time 8.      Manual Therapy   Manual Therapy Soft tissue mobilization    Soft tissue mobilization STW to R greater trochanter, glute, ITB to reduce tone and pain                          PT Long Term Goals - 03/04/21 1107       PT LONG TERM GOAL #1   Title Independent with a HEP.    Time 6    Period Weeks    Status New      PT LONG TERM GOAL #2   Title Perform ADL's with pain not > 2-3/10.    Time 6    Period Weeks    Status New      PT LONG TERM GOAL #3   Title Walk one mile with pain not > 2/10.    Time  6    Period Weeks    Status New      PT LONG TERM GOAL #4   Title Return to playing Pickleball.    Time 6    Period Weeks    Status New      PT LONG TERM GOAL #5   Title Normal gait pattern.    Time 6    Period Weeks    Status New      PT LONG TERM GOAL #6   Title Stand 30 minutes wiht pain not > 2-3/10.    Time 6    Period Weeks    Status New                   Plan - 03/12/21 1128     Clinical Impression Statement Patient presented in clinic with reports of overall improvement but some pain still present. Patient eager to return to full activity such as walking 4 miles a day, pickleball. Patient still very tender to palpation over the R greater trochanter and tender into glute. Normal modalities response noted following removal of the modalities.    Personal Factors and Comorbidities Comorbidity 1;Other    Comorbidities OA, Hypothyroidism, osteopenia.    Examination-Activity Limitations Sit;Stand;Other;Locomotion Level    Examination-Participation Restrictions Cleaning;Other    Stability/Clinical Decision Making Evolving/Moderate complexity    Rehab Potential Good    PT Frequency 2x / week    PT Duration 6 weeks    PT Treatment/Interventions ADLs/Self Care Home Management;Cryotherapy;Electrical Stimulation;Ultrasound;Traction;Moist Heat;Iontophoresis 4mg /ml Dexamethasone;Functional mobility training;Therapeutic exercise;Manual techniques;Patient/family education;Passive range of motion;Dry needling;Joint Manipulations;Other (comment)    PT Next Visit Plan FOTO......Marland KitchenDry Needling, in left sdly position with pillow between knees.Marland KitchenMarland KitchenCombo e'stim/US to right SIJ and right lateral hip f/b STW/M, Iontophoresis to right greater trochanter region.  Recumbent bike.  McKenzie extension exercise for lumbar region.  "Clamshells"; sdly hip abduction.ay consider trying intermittment lumbar traction beginning at 35% bodyweight.    Consulted and Agree with Plan of Care Patient  Patient will benefit from skilled therapeutic intervention in order to improve the following deficits and impairments:  Abnormal gait, Difficulty walking, Decreased activity tolerance, Pain  Visit Diagnosis: Pain in right hip  Acute right-sided low back pain without sciatica     Problem List Patient Active Problem List   Diagnosis Date Noted   Vitamin D deficiency 02/24/2018   Anemia 02/24/2018   Postablative hypothyroidism 02/24/2018   Osteopenia 02/24/2018   Special screening for malignant neoplasms, colon 09/09/2016   Other reasons for seeking consultation 12/06/2013    Standley Brooking, PTA 03/12/2021, 11:32 AM  [facility] [address] [place], [place], [address] Phone: [phone]   Fax:  [phone]  Name: Misty Gomez MRN: [id number] Date of Birth: 1956-02-13

## 2021-03-16 ENCOUNTER — Other Ambulatory Visit: Payer: Self-pay

## 2021-03-16 ENCOUNTER — Ambulatory Visit: Payer: Medicare PPO | Attending: Family Medicine | Admitting: Physical Therapy

## 2021-03-16 DIAGNOSIS — M545 Low back pain, unspecified: Secondary | ICD-10-CM | POA: Insufficient documentation

## 2021-03-16 DIAGNOSIS — M25551 Pain in right hip: Secondary | ICD-10-CM | POA: Diagnosis not present

## 2021-03-16 NOTE — Therapy (Signed)
[facility] [place], [place], [address] Phone: [phone]   Fax:  [phone]  Physical Therapy Treatment  Patient Details  Name: Misty Gomez MRN: [id number] Date of Birth: 09-Dec-1955 Referring Provider (PT): Ronnie Doss DO   Encounter Date: 03/16/2021   PT End of Session - 03/16/21 1130     Visit Number 4    Number of Visits 12    PT Start Time 0945    PT Stop Time 1046    PT Time Calculation (min) 61 min    Activity Tolerance Patient tolerated treatment well    Behavior During Therapy [facility] for tasks assessed/performed             Past Medical History:  Diagnosis Date   Anemia    Arthritis    Asthma    GERD (gastroesophageal reflux disease)    Hypothyroidism    Thyroid eye disease 2017   Vitamin D deficiency     Past Surgical History:  Procedure Laterality Date   BARTHOLIN GLAND CYST EXCISION     COLONOSCOPY N/A 01/06/2017   Procedure: COLONOSCOPY;  Surgeon: Rogene Houston, MD;  [facility];  Service: Endoscopy;  Laterality: N/A;  930   POLYPECTOMY  01/06/2017   Procedure: POLYPECTOMY;  Surgeon: Rogene Houston, MD;  [facility];  Service: Endoscopy;;  colon    There were no vitals filed for this visit.   Subjective Assessment - 03/16/21 1130     Subjective COVID-19 screen performed prior to patient entering clinic.  Having a good day with a pain-level at 2.    Patient is accompained by: Family member    Limitations Sitting;Standing;Walking;House hold activities;Other (comment)    How long can you sit comfortably? She is okay if she sits perfectly still.    How long can you stand comfortably? 10 minutes or less.    How long can you walk comfortably? Tried to walk a half mile but the pain was too much.  Prior to the onset of her right hip pain she walked 3-4 miles per day and played Pickleball and cleaned house.    Diagnostic tests X-rays, MRI....please see under  "Imaging" tab.    Patient Stated Goals Get back to former lifestyle.    Currently in Pain? Yes    Pain Score 2     Pain Location Hip    Pain Orientation Right    Pain Type Acute pain    Pain Onset More than a month ago                               [facility] Adult PT Treatment/Exercise - 03/16/21 0001       Modalities   Modalities Electrical Stimulation;Ultrasound;Iontophoresis      Electrical Stimulation   Electrical Stimulation Location Right lateral hip region.    Electrical Stimulation Action IFC at 80-150 Hz.    Electrical Stimulation Parameters 40% scan x 15 minutes.    Electrical Stimulation Goals Pain;Tone      Ultrasound   Ultrasound Location Right lateral hip region.    Ultrasound Parameters Combo e'stim/US at 1.50 W/CM2 x 12 minutes.      Iontophoresis   Type of Iontophoresis Dexamethasone    Location RT greater trochanter    Dose 80 mA-Min.      Manual Therapy   Manual Therapy Soft tissue mobilization    Soft tissue mobilization STW/M  x 13 minutes to patient's right SIJ and lateral hip musculature to reduce tone.              Trigger Point Dry Needling - 03/16/21 0001     Consent Given? Yes    Education Handout Provided Yes    Muscles Treated Back/Hip Gluteus medius;Tensor fascia lata    Gluteus Medius Response Twitch response elicited    Tensor Fascia Lata Response Twitch response elicited                        PT Long Term Goals - 03/04/21 1107       PT LONG TERM GOAL #1   Title Independent with a HEP.    Time 6    Period Weeks    Status New      PT LONG TERM GOAL #2   Title Perform ADL's with pain not > 2-3/10.    Time 6    Period Weeks    Status New      PT LONG TERM GOAL #3   Title Walk one mile with pain not > 2/10.    Time 6    Period Weeks    Status New      PT LONG TERM GOAL #4   Title Return to playing Pickleball.    Time 6    Period Weeks    Status New      PT LONG TERM GOAL #5    Title Normal gait pattern.    Time 6    Period Weeks    Status New      PT LONG TERM GOAL #6   Title Stand 30 minutes wiht pain not > 2-3/10.    Time 6    Period Weeks    Status New                   Plan - 03/16/21 1134     Clinical Impression Statement The patient is responding well to treatments reporting a low pain-level upon arrival today.  She did very well with dry needling today to the right glut med and TFL.  Normal modality response.    Personal Factors and Comorbidities Comorbidity 1;Other    Comorbidities OA, Hypothyroidism, osteopenia.    Examination-Activity Limitations Sit;Stand;Other;Locomotion Level    Examination-Participation Restrictions Cleaning;Other    Stability/Clinical Decision Making Evolving/Moderate complexity    Rehab Potential Good    PT Frequency 2x / week    PT Duration 6 weeks    PT Treatment/Interventions ADLs/Self Care Home Management;Cryotherapy;Electrical Stimulation;Ultrasound;Traction;Moist Heat;Iontophoresis 4mg /ml Dexamethasone;Functional mobility training;Therapeutic exercise;Manual techniques;Patient/family education;Passive range of motion;Dry needling;Joint Manipulations;Other (comment)    PT Next Visit Plan FOTO......Marland KitchenDry Needling, in left sdly position with pillow between knees.Marland KitchenMarland KitchenCombo e'stim/US to right SIJ and right lateral hip f/b STW/M, Iontophoresis to right greater trochanter region.  Recumbent bike.  McKenzie extension exercise for lumbar region.  "Clamshells"; sdly hip abduction.ay consider trying intermittment lumbar traction beginning at 35% bodyweight.    Consulted and Agree with Plan of Care Patient             Patient will benefit from skilled therapeutic intervention in order to improve the following deficits and impairments:  Abnormal gait, Difficulty walking, Decreased activity tolerance, Pain  Visit Diagnosis: Pain in right hip  Acute right-sided low back pain without sciatica     Problem  List Patient Active Problem List   Diagnosis Date Noted   Vitamin D deficiency 02/24/2018  Anemia 02/24/2018   Postablative hypothyroidism 02/24/2018   Osteopenia 02/24/2018   Special screening for malignant neoplasms, colon 09/09/2016   Other reasons for seeking consultation 12/06/2013    Ruqayya Ventress, [place], PT 03/16/2021, 11:37 AM  [facility] [place], [place], [address] Phone: [phone]   Fax:  [phone]  Name: GAIGE FUSSNER MRN: [id number] Date of Birth: 10/24/1955

## 2021-03-19 ENCOUNTER — Ambulatory Visit: Payer: Medicare PPO | Admitting: Physical Therapy

## 2021-03-19 ENCOUNTER — Other Ambulatory Visit: Payer: Self-pay

## 2021-03-19 DIAGNOSIS — M25551 Pain in right hip: Secondary | ICD-10-CM | POA: Diagnosis not present

## 2021-03-19 DIAGNOSIS — M545 Low back pain, unspecified: Secondary | ICD-10-CM | POA: Diagnosis not present

## 2021-03-19 NOTE — Therapy (Signed)
[facility] [place], [place], [address] Phone: [phone]   Fax:  [phone]  Physical Therapy Treatment  Patient Details  Name: LEALER MARSLAND MRN: [id number] Date of Birth: January 12, 1956 Referring Provider (PT): Ronnie Doss DO   Encounter Date: 03/19/2021   PT End of Session - 03/19/21 1349     Visit Number 5    Number of Visits 12    Date for PT Re-Evaluation 04/15/21    PT Start Time 0100    PT Stop Time 0149    PT Time Calculation (min) 49 min    Activity Tolerance Patient tolerated treatment well    Behavior During Therapy [facility] for tasks assessed/performed             Past Medical History:  Diagnosis Date   Anemia    Arthritis    Asthma    GERD (gastroesophageal reflux disease)    Hypothyroidism    Thyroid eye disease 2017   Vitamin D deficiency     Past Surgical History:  Procedure Laterality Date   BARTHOLIN GLAND CYST EXCISION     COLONOSCOPY N/A 01/06/2017   Procedure: COLONOSCOPY;  Surgeon: Rogene Houston, MD;  [facility];  Service: Endoscopy;  Laterality: N/A;  930   POLYPECTOMY  01/06/2017   Procedure: POLYPECTOMY;  Surgeon: Rogene Houston, MD;  [facility];  Service: Endoscopy;;  colon    There were no vitals filed for this visit.   Subjective Assessment - 03/19/21 1350     Subjective COVID-19 screen performed prior to patient entering clinic.  Patient did great with dry needling.  Low pain-level today.    Pertinent History OA, Hypothyroidism, osteopenia.    Limitations Sitting;Standing;Walking;House hold activities;Other (comment)    How long can you sit comfortably? She is okay if she sits perfectly still.    How long can you stand comfortably? 10 minutes or less.    How long can you walk comfortably? Tried to walk a half mile but the pain was too much.  Prior to the onset of her right hip pain she walked 3-4 miles per day and played Pickleball and  cleaned house.    Diagnostic tests X-rays, MRI....please see under "Imaging" tab.    Patient Stated Goals Get back to former lifestyle.    Currently in Pain? Yes    Pain Location Hip    Pain Orientation Right    Pain Descriptors / Indicators Discomfort    Pain Type Acute pain    Pain Onset More than a month ago                               [facility] Adult PT Treatment/Exercise - 03/19/21 0001       Modalities   Modalities Electrical Stimulation;Ultrasound      Electrical Stimulation   Electrical Stimulation Location Right hip/LB    Electrical Stimulation Action IFC at 80-150 Hz.    Electrical Stimulation Parameters 40% scan x 10 minutes.    Electrical Stimulation Goals Tone      Ultrasound   Ultrasound Location Right lateral hip    Ultrasound Parameters Combo e'stim/US at 1.50 W/CM2 x 12 minutes.    Ultrasound Goals Pain      Iontophoresis   Type of Iontophoresis Dexamethasone    Location Right greater trochanter.    Dose 80 mA-Min.      Manual Therapy  Manual Therapy Soft tissue mobilization    Soft tissue mobilization STW/M x 12 minutes to patient's right LB/SIJ and affected hip musculature.              Trigger Point Dry Needling - 03/19/21 0001     Consent Given? Yes    Muscles Treated Back/Hip Gluteus minimus;Tensor fascia lata    Gluteus Medius Response Twitch response elicited    Tensor Fascia Lata Response Twitch response elicited                        PT Long Term Goals - 03/04/21 1107       PT LONG TERM GOAL #1   Title Independent with a HEP.    Time 6    Period Weeks    Status New      PT LONG TERM GOAL #2   Title Perform ADL's with pain not > 2-3/10.    Time 6    Period Weeks    Status New      PT LONG TERM GOAL #3   Title Walk one mile with pain not > 2/10.    Time 6    Period Weeks    Status New      PT LONG TERM GOAL #4   Title Return to playing Pickleball.    Time 6    Period Weeks    Status  New      PT LONG TERM GOAL #5   Title Normal gait pattern.    Time 6    Period Weeks    Status New      PT LONG TERM GOAL #6   Title Stand 30 minutes wiht pain not > 2-3/10.    Time 6    Period Weeks    Status New                   Plan - 03/19/21 1412     Clinical Impression Statement Patient had a gerat response to dry needling.  She states she sat for a long period of time at a funeral yesterday and did well.  Her gait was non-antalgic and she is able to do more with less pain.  The patient has been very motivated to improve.    Personal Factors and Comorbidities Comorbidity 1;Other    Comorbidities OA, Hypothyroidism, osteopenia.    Examination-Activity Limitations Sit;Stand;Other;Locomotion Level    Examination-Participation Restrictions Cleaning;Other    Stability/Clinical Decision Making Evolving/Moderate complexity    Rehab Potential Good    PT Frequency 2x / week    PT Duration 6 weeks    PT Treatment/Interventions ADLs/Self Care Home Management;Cryotherapy;Electrical Stimulation;Ultrasound;Traction;Moist Heat;Iontophoresis 4mg /ml Dexamethasone;Functional mobility training;Therapeutic exercise;Manual techniques;Patient/family education;Passive range of motion;Dry needling;Joint Manipulations;Other (comment)    PT Next Visit Plan FOTO......Marland KitchenDry Needling, in left sdly position with pillow between knees.Marland KitchenMarland KitchenCombo e'stim/US to right SIJ and right lateral hip f/b STW/M, Iontophoresis to right greater trochanter region.  Recumbent bike.  McKenzie extension exercise for lumbar region.  "Clamshells"; sdly hip abduction.ay consider trying intermittment lumbar traction beginning at 35% bodyweight.    Consulted and Agree with Plan of Care Patient             Patient will benefit from skilled therapeutic intervention in order to improve the following deficits and impairments:  Abnormal gait, Difficulty walking, Decreased activity tolerance, Pain  Visit Diagnosis: Pain in  right hip  Acute right-sided low back pain without sciatica     Problem  List Patient Active Problem List   Diagnosis Date Noted   Vitamin D deficiency 02/24/2018   Anemia 02/24/2018   Postablative hypothyroidism 02/24/2018   Osteopenia 02/24/2018   Special screening for malignant neoplasms, colon 09/09/2016   Other reasons for seeking consultation 12/06/2013    Trany Chernick, [place], PT 03/19/2021, 2:14 PM  [facility] [address] [place], [place], [address] Phone: [phone]   Fax:  [phone]  Name: NAUTICA HOTZ MRN: [id number] Date of Birth: 1955-08-08

## 2021-03-20 ENCOUNTER — Encounter: Payer: 59 | Admitting: Family Medicine

## 2021-03-23 ENCOUNTER — Other Ambulatory Visit: Payer: Self-pay

## 2021-03-23 ENCOUNTER — Ambulatory Visit: Payer: Medicare PPO | Admitting: Physical Therapy

## 2021-03-23 DIAGNOSIS — M545 Low back pain, unspecified: Secondary | ICD-10-CM | POA: Diagnosis not present

## 2021-03-23 DIAGNOSIS — M25551 Pain in right hip: Secondary | ICD-10-CM

## 2021-03-23 NOTE — Therapy (Signed)
[facility] [place], [place], [address] Phone: [phone]   Fax:  [phone]  Physical Therapy Treatment  Patient Details  Name: Misty Gomez MRN: [id number] Date of Birth: Feb 01, 1956 Referring Provider (PT): Ronnie Doss DO   Encounter Date: 03/23/2021   PT End of Session - 03/23/21 1026     Visit Number 6    Number of Visits 12    Date for PT Re-Evaluation 04/15/21    PT Start Time 0815    PT Stop Time 0908    PT Time Calculation (min) 53 min    Activity Tolerance Patient tolerated treatment well    Behavior During Therapy [facility] for tasks assessed/performed             Past Medical History:  Diagnosis Date   Anemia    Arthritis    Asthma    GERD (gastroesophageal reflux disease)    Hypothyroidism    Thyroid eye disease 2017   Vitamin D deficiency     Past Surgical History:  Procedure Laterality Date   BARTHOLIN GLAND CYST EXCISION     COLONOSCOPY N/A 01/06/2017   Procedure: COLONOSCOPY;  Surgeon: Rogene Houston, MD;  [facility];  Service: Endoscopy;  Laterality: N/A;  930   POLYPECTOMY  01/06/2017   Procedure: POLYPECTOMY;  Surgeon: Rogene Houston, MD;  [facility];  Service: Endoscopy;;  colon    There were no vitals filed for this visit.   Subjective Assessment - 03/23/21 1025     Subjective COVID-19 screen performed prior to patient entering clinic.  Doing good.    Patient is accompained by: Family member    Pertinent History OA, Hypothyroidism, osteopenia.    How long can you sit comfortably? She is okay if she sits perfectly still.    How long can you stand comfortably? 10 minutes or less.    How long can you walk comfortably? Tried to walk a half mile but the pain was too much.  Prior to the onset of her right hip pain she walked 3-4 miles per day and played Pickleball and cleaned house.    Diagnostic tests X-rays, MRI....please see under "Imaging" tab.     Patient Stated Goals Get back to former lifestyle.    Currently in Pain? Yes    Pain Score 2     Pain Location Hip    Pain Orientation Right    Pain Descriptors / Indicators Discomfort    Pain Onset More than a month ago                               [facility] Adult PT Treatment/Exercise - 03/23/21 0001       Modalities   Modalities Electrical Stimulation;Ultrasound      Electrical Stimulation   Electrical Stimulation Location Right hip/LB    Electrical Stimulation Action IFC at 80-150 Hz.    Electrical Stimulation Parameters 40% scan x 15 minutes.      Ultrasound   Ultrasound Location Right lateral hip    Ultrasound Parameters Combo e'stim/US at 80-150 Hz. x 12 minutes.    Ultrasound Goals Pain      Manual Therapy   Manual Therapy Soft tissue mobilization    Soft tissue mobilization STW/M x 11 minutes to affected lateral hip, SIJ and  low back musculature to decrease tone.  Trigger Point Dry Needling - 03/23/21 0001     Consent Given? Yes    Muscles Treated Back/Hip Gluteus medius;Gluteus minimus;Tensor fascia lata    Gluteus Minimus Response Twitch response elicited    Gluteus Medius Response Twitch response elicited    Tensor Fascia Lata Response Twitch response elicited                        PT Long Term Goals - 03/04/21 1107       PT LONG TERM GOAL #1   Title Independent with a HEP.    Time 6    Period Weeks    Status New      PT LONG TERM GOAL #2   Title Perform ADL's with pain not > 2-3/10.    Time 6    Period Weeks    Status New      PT LONG TERM GOAL #3   Title Walk one mile with pain not > 2/10.    Time 6    Period Weeks    Status New      PT LONG TERM GOAL #4   Title Return to playing Pickleball.    Time 6    Period Weeks    Status New      PT LONG TERM GOAL #5   Title Normal gait pattern.    Time 6    Period Weeks    Status New      PT LONG TERM GOAL #6   Title Stand 30 minutes wiht  pain not > 2-3/10.    Time 6    Period Weeks    Status New                   Plan - 03/23/21 1040     Clinical Impression Statement Patient has been responding very well to treatment and also since the addition of dry needling.  She had no significant pain complaints following treatment today.    Personal Factors and Comorbidities Comorbidity 1;Other    Comorbidities OA, Hypothyroidism, osteopenia.    Examination-Participation Restrictions Cleaning;Other    Stability/Clinical Decision Making Evolving/Moderate complexity    Rehab Potential Good    PT Frequency 2x / week    PT Duration 6 weeks    PT Treatment/Interventions ADLs/Self Care Home Management;Cryotherapy;Electrical Stimulation;Ultrasound;Traction;Moist Heat;Iontophoresis 4mg /ml Dexamethasone;Functional mobility training;Therapeutic exercise;Manual techniques;Patient/family education;Passive range of motion;Dry needling;Joint Manipulations;Other (comment)    PT Next Visit Plan FOTO......Marland KitchenDry Needling, in left sdly position with pillow between knees.Marland KitchenMarland KitchenCombo e'stim/US to right SIJ and right lateral hip f/b STW/M, Iontophoresis to right greater trochanter region.  Recumbent bike.  McKenzie extension exercise for lumbar region.  "Clamshells"; sdly hip abduction.ay consider trying intermittment lumbar traction beginning at 35% bodyweight.    Consulted and Agree with Plan of Care Patient             Patient will benefit from skilled therapeutic intervention in order to improve the following deficits and impairments:  Abnormal gait, Difficulty walking, Decreased activity tolerance, Pain  Visit Diagnosis: Pain in right hip  Acute right-sided low back pain without sciatica     Problem List Patient Active Problem List   Diagnosis Date Noted   Vitamin D deficiency 02/24/2018   Anemia 02/24/2018   Postablative hypothyroidism 02/24/2018   Osteopenia 02/24/2018   Special screening for malignant neoplasms, colon  09/09/2016   Other reasons for seeking consultation 12/06/2013    Toan Mort, [place], PT 03/23/2021, 10:43 AM  [facility] [place], [place], [address] Phone: [phone]   Fax:  [phone]  Name: Misty Gomez MRN: [id number] Date of Birth: Oct 28, 1955

## 2021-03-26 ENCOUNTER — Ambulatory Visit: Payer: Medicare PPO | Admitting: Physical Therapy

## 2021-03-26 ENCOUNTER — Other Ambulatory Visit: Payer: Self-pay

## 2021-03-26 DIAGNOSIS — M545 Low back pain, unspecified: Secondary | ICD-10-CM | POA: Diagnosis not present

## 2021-03-26 DIAGNOSIS — M25551 Pain in right hip: Secondary | ICD-10-CM | POA: Diagnosis not present

## 2021-03-26 NOTE — Therapy (Signed)
[facility] [place], [place], [address] Phone: [phone]   Fax:  [phone]  Physical Therapy Treatment  Patient Details  Name: Misty Gomez MRN: [id number] Date of Birth: Oct 31, 1955 Referring Provider (PT): Ronnie Doss DO   Encounter Date: 03/26/2021   PT End of Session - 03/26/21 1147     Visit Number 7    Number of Visits 12    Date for PT Re-Evaluation 04/15/21    PT Start Time 0945    PT Stop Time 1042    PT Time Calculation (min) 57 min    Activity Tolerance Patient tolerated treatment well    Behavior During Therapy [facility] for tasks assessed/performed             Past Medical History:  Diagnosis Date   Anemia    Arthritis    Asthma    GERD (gastroesophageal reflux disease)    Hypothyroidism    Thyroid eye disease 2017   Vitamin D deficiency     Past Surgical History:  Procedure Laterality Date   BARTHOLIN GLAND CYST EXCISION     COLONOSCOPY N/A 01/06/2017   Procedure: COLONOSCOPY;  Surgeon: Rogene Houston, MD;  [facility];  Service: Endoscopy;  Laterality: N/A;  930   POLYPECTOMY  01/06/2017   Procedure: POLYPECTOMY;  Surgeon: Rogene Houston, MD;  [facility];  Service: Endoscopy;;  colon    There were no vitals filed for this visit.   Subjective Assessment - 03/26/21 1148     Subjective COVID-19 screen performed prior to patient entering clinic.  Much, much better.    Patient is accompained by: Family member    Pertinent History OA, Hypothyroidism, osteopenia.    Limitations Sitting;Standing;Walking;House hold activities;Other (comment)    How long can you sit comfortably? She is okay if she sits perfectly still.    Diagnostic tests X-rays, MRI....please see under "Imaging" tab.    Patient Stated Goals Get back to former lifestyle.    Currently in Pain? Yes    Pain Score 1     Pain Location Hip    Pain Orientation Right    Pain Descriptors / Indicators  Dull;Discomfort    Pain Type Acute pain    Pain Onset More than a month ago                               [facility] Adult PT Treatment/Exercise - 03/26/21 0001       Modalities   Modalities Electrical Stimulation;Iontophoresis;Ultrasound      Acupuncturist Location Right hip    Electrical Stimulation Action IFC at 80-150 Hz.    Electrical Stimulation Parameters 40% scan x 15 minutes.    Electrical Stimulation Goals Pain;Tone      Ultrasound   Ultrasound Location Right lateral hip.    Ultrasound Parameters Commbo e'stim/US at 1.50 W/CM2 x 12 minutes.    Ultrasound Goals Pain      Iontophoresis   Type of Iontophoresis Dexamethasone    Location RT greater trochanter    Dose 80 mA-Min    Time 8      Manual Therapy   Manual Therapy Soft tissue mobilization    Soft tissue mobilization STW/M x 11 minutes to right lateral hip musculature to decrease tone.  PT Long Term Goals - 03/26/21 1152       PT LONG TERM GOAL #1   Title Independent with a HEP.    Period Weeks    Status Partially Met      PT LONG TERM GOAL #2   Title Perform ADL's with pain not > 2-3/10.    Time 6    Period Weeks    Status Partially Met      PT LONG TERM GOAL #3   Title Walk one mile with pain not > 2/10.    Time 6    Period Weeks      PT LONG TERM GOAL #4   Title Return to playing Pickleball.    Time 6    Period Weeks    Status On-going      PT LONG TERM GOAL #5   Title Normal gait pattern.    Time 6    Period Weeks    Status Achieved      PT LONG TERM GOAL #6   Title Stand 30 minutes wiht pain not > 2-3/10.    Time 6    Period Weeks    Status Partially Met                   Plan - 03/26/21 1151     Clinical Impression Statement Patient doing very well.  Normal gait pattern re-established.  Reviewed HEP.  Patient compliant and performs with excellent technique.    Personal  Factors and Comorbidities Comorbidity 1;Other    Comorbidities OA, Hypothyroidism, osteopenia.    Examination-Activity Limitations Sit;Stand;Other;Locomotion Level    Examination-Participation Restrictions Cleaning;Other    Stability/Clinical Decision Making Evolving/Moderate complexity    Rehab Potential Good    PT Frequency 2x / week    PT Duration 6 weeks    PT Treatment/Interventions ADLs/Self Care Home Management;Cryotherapy;Electrical Stimulation;Ultrasound;Traction;Moist Heat;Iontophoresis 4m/ml Dexamethasone;Functional mobility training;Therapeutic exercise;Manual techniques;Patient/family education;Passive range of motion;Dry needling;Joint Manipulations;Other (comment)    PT Next Visit Plan FOTO.......Marland Kitchenry Needling, in left sdly position with pillow between knees..Marland KitchenMarland Kitchenombo e'stim/US to right SIJ and right lateral hip f/b STW/M, Iontophoresis to right greater trochanter region.  Recumbent bike.  McKenzie extension exercise for lumbar region.  "Clamshells"; sdly hip abduction.ay consider trying intermittment lumbar traction beginning at 35% bodyweight.    Consulted and Agree with Plan of Care Patient             Patient will benefit from skilled therapeutic intervention in order to improve the following deficits and impairments:  Abnormal gait, Difficulty walking, Decreased activity tolerance, Pain  Visit Diagnosis: Pain in right hip  Acute right-sided low back pain without sciatica     Problem List Patient Active Problem List   Diagnosis Date Noted   Vitamin D deficiency 02/24/2018   Anemia 02/24/2018   Postablative hypothyroidism 02/24/2018   Osteopenia 02/24/2018   Special screening for malignant neoplasms, colon 09/09/2016   Other reasons for seeking consultation 12/06/2013    Luster Hechler, CMali PT 03/26/2021, 11:54 AM  [facility] [address]Phone: [phone]  Fax:  [phone] Name:  Misty MOMANMRN: [id number] of Birth: 119-Oct-1957

## 2021-03-30 ENCOUNTER — Ambulatory Visit: Payer: Medicare PPO | Admitting: Physical Therapy

## 2021-03-30 ENCOUNTER — Other Ambulatory Visit: Payer: Self-pay

## 2021-03-30 DIAGNOSIS — M25551 Pain in right hip: Secondary | ICD-10-CM | POA: Diagnosis not present

## 2021-03-30 DIAGNOSIS — M545 Low back pain, unspecified: Secondary | ICD-10-CM

## 2021-03-30 NOTE — Therapy (Addendum)
[facility] [place], [place], [address] Phone: [phone]   Fax:  [phone]  Physical Therapy Treatment  Patient Details  Name: Misty Gomez MRN: [id number] Date of Birth: [birth year] Referring Provider (PT): Ronnie Doss DO   Encounter Date: 03/30/2021   PT End of Session - 03/30/21 1200     Visit Number 8    Number of Visits 12    Date for PT Re-Evaluation 04/15/21    PT Start Time 1030    PT Stop Time 1122    PT Time Calculation (min) 52 min    Activity Tolerance Patient tolerated treatment well    Behavior During Therapy [facility] for tasks assessed/performed             Past Medical History:  Diagnosis Date   Anemia    Arthritis    Asthma    GERD (gastroesophageal reflux disease)    Hypothyroidism    Thyroid eye disease 2017   Vitamin D deficiency     Past Surgical History:  Procedure Laterality Date   BARTHOLIN GLAND CYST EXCISION     COLONOSCOPY N/A 01/06/2017   Procedure: COLONOSCOPY;  Surgeon: Rogene Houston, MD;  [facility];  Service: Endoscopy;  Laterality: N/A;  930   POLYPECTOMY  01/06/2017   Procedure: POLYPECTOMY;  Surgeon: Rogene Houston, MD;  [facility];  Service: Endoscopy;;  colon    There were no vitals filed for this visit.   Subjective Assessment - 03/30/21 1201     Subjective COVID-19 screen performed prior to patient entering clinic.  The patient is very pleased very her progress.  Standing on concrete and sitting for prolonged periods of time still increase her pain.  We discussed the importance of good footwear.    Patient is accompained by: Family member    Pertinent History OA, Hypothyroidism, osteopenia.    Limitations Sitting;Standing;Walking;House hold activities;Other (comment)    How long can you sit comfortably? She is okay if she sits perfectly still.    How long can you stand comfortably? 10 minutes or less.    How long can you walk  comfortably? Tried to walk a half mile but the pain was too much.  Prior to the onset of her right hip pain she walked 3-4 miles per day and played Pickleball and cleaned house.    Diagnostic tests X-rays, MRI....please see under "Imaging" tab.    Patient Stated Goals Get back to former lifestyle.    Currently in Pain? Yes    Pain Score 1     Pain Location Hip    Pain Orientation Right    Pain Descriptors / Indicators Dull;Discomfort                               [facility] Adult PT Treatment/Exercise - 03/30/21 0001       Exercises   Exercises Knee/Hip      Knee/Hip Exercises: Aerobic   Recumbent Bike Level 3 x 8 minutes.      Modalities   Modalities Electrical Stimulation;Ultrasound;Iontophoresis      Electrical Stimulation   Electrical Stimulation Location Righ hip.    Electrical Stimulation Action Pre-mod.    Electrical Stimulation Parameters 80-150 Hzx x 10 minutes.    Electrical Stimulation Goals Tone      Ultrasound   Ultrasound Location Right later hip musculature.    Ultrasound Parameters Combo e'stim/US at  1.50 W/CM2 x 8 minutes.    Ultrasound Goals Pain      Iontophoresis   Type of Iontophoresis Dexamethasone    Location RT greater trochanter    Dose 80 mA-Min  (6/6 treatments)    Time 8.      Manual Therapy   Manual Therapy Soft tissue mobilization    Soft tissue mobilization STW/M x 8 minutes to patient's right lateral hip musculature to decrease tone.              Trigger Point Dry Needling - 03/30/21 0001     Consent Given? Yes    Muscles Treated Back/Hip Gluteus medius;Tensor fascia lata;Gluteus minimus    Gluteus Minimus Response Twitch response elicited    Gluteus Medius Response Twitch response elicited    Tensor Fascia Lata Response Twitch response elicited                        PT Long Term Goals - 03/26/21 1152       PT LONG TERM GOAL #1   Title Independent with a HEP.    Period Weeks    Status  Partially Met      PT LONG TERM GOAL #2   Title Perform ADL's with pain not > 2-3/10.    Time 6    Period Weeks    Status Partially Met      PT LONG TERM GOAL #3   Title Walk one mile with pain not > 2/10.    Time 6    Period Weeks      PT LONG TERM GOAL #4   Title Return to playing Pickleball.    Time 6    Period Weeks    Status On-going      PT LONG TERM GOAL #5   Title Normal gait pattern.    Time 6    Period Weeks    Status Achieved      PT LONG TERM GOAL #6   Title Stand 30 minutes wiht pain not > 2-3/10.    Time 6    Period Weeks    Status Partially Met                   Plan - 03/30/21 1206     Clinical Impression Statement The patient is very motivated.  She did great on the recumbent bike today without complaint.  She still experinences increased pain with prolonged sitting and standing on concrete.    Personal Factors and Comorbidities Comorbidity 1;Other    Comorbidities OA, Hypothyroidism, osteopenia.    Examination-Activity Limitations Sit;Stand;Other;Locomotion Level    Examination-Participation Restrictions Cleaning;Other    Stability/Clinical Decision Making Evolving/Moderate complexity    Rehab Potential Good    PT Frequency 2x / week    PT Duration 6 weeks    PT Treatment/Interventions ADLs/Self Care Home Management;Cryotherapy;Electrical Stimulation;Ultrasound;Traction;Moist Heat;Iontophoresis 83m/ml Dexamethasone;Functional mobility training;Therapeutic exercise;Manual techniques;Patient/family education;Passive range of motion;Dry needling;Joint Manipulations;Other (comment)    PT Next Visit Plan FOTO.......Marland Kitchenry Needling, in left sdly position with pillow between knees..Marland KitchenMarland Kitchenombo e'stim/US to right SIJ and right lateral hip f/b STW/M, Iontophoresis to right greater trochanter region.  Recumbent bike.  McKenzie extension exercise for lumbar region.  "Clamshells"; sdly hip abduction.ay consider trying intermittment lumbar traction beginning at 35%  bodyweight.    Consulted and Agree with Plan of Care Patient             Patient will benefit from skilled therapeutic intervention in order  to improve the following deficits and impairments:  Abnormal gait, Difficulty walking, Decreased activity tolerance, Pain  Visit Diagnosis: Acute right-sided low back pain without sciatica  Pain in right hip     Problem List Patient Active Problem List   Diagnosis Date Noted   Vitamin D deficiency 02/24/2018   Anemia 02/24/2018   Postablative hypothyroidism 02/24/2018   Osteopenia 02/24/2018   Special screening for malignant neoplasms, colon 09/09/2016   Other reasons for seeking consultation 12/06/2013   PHYSICAL THERAPY DISCHARGE SUMMARY  Visits from Start of Care: 8.  Current functional level related to goals / functional outcomes: See above.   Remaining deficits: Continued pain with prolonged sitting and standing.   Education / Equipment: HEP.   Patient agrees to discharge. Patient goals were partially met. Patient is being discharged due to not returning since the last visit.  APPLE[place], [place], PT 03/30/2021, 12:10 PM  [facility] [address] [place], [place], [address] Phone: [phone]   Fax:  [phone]  Name: Misty Gomez MRN: [id number] Date of Birth: [birth year]

## 2021-03-31 ENCOUNTER — Other Ambulatory Visit: Payer: Self-pay | Admitting: Family Medicine

## 2021-04-01 ENCOUNTER — Ambulatory Visit (INDEPENDENT_AMBULATORY_CARE_PROVIDER_SITE_OTHER): Payer: Medicare PPO | Admitting: Family Medicine

## 2021-04-01 ENCOUNTER — Other Ambulatory Visit: Payer: Self-pay

## 2021-04-01 ENCOUNTER — Encounter: Payer: Self-pay | Admitting: Family Medicine

## 2021-04-01 VITALS — BP 125/80 | HR 57 | Temp 97.8°F | Ht 66.0 in | Wt 193.8 lb

## 2021-04-01 DIAGNOSIS — M545 Low back pain, unspecified: Secondary | ICD-10-CM

## 2021-04-01 DIAGNOSIS — Z0001 Encounter for general adult medical examination with abnormal findings: Secondary | ICD-10-CM

## 2021-04-01 DIAGNOSIS — M25551 Pain in right hip: Secondary | ICD-10-CM

## 2021-04-01 DIAGNOSIS — R718 Other abnormality of red blood cells: Secondary | ICD-10-CM | POA: Diagnosis not present

## 2021-04-01 DIAGNOSIS — E559 Vitamin D deficiency, unspecified: Secondary | ICD-10-CM

## 2021-04-01 DIAGNOSIS — G8929 Other chronic pain: Secondary | ICD-10-CM

## 2021-04-01 DIAGNOSIS — E782 Mixed hyperlipidemia: Secondary | ICD-10-CM | POA: Diagnosis not present

## 2021-04-01 DIAGNOSIS — M8588 Other specified disorders of bone density and structure, other site: Secondary | ICD-10-CM

## 2021-04-01 DIAGNOSIS — E890 Postprocedural hypothyroidism: Secondary | ICD-10-CM | POA: Diagnosis not present

## 2021-04-01 DIAGNOSIS — Z0000 Encounter for general adult medical examination without abnormal findings: Secondary | ICD-10-CM

## 2021-04-01 DIAGNOSIS — Z66 Do not resuscitate: Secondary | ICD-10-CM

## 2021-04-01 NOTE — Progress Notes (Signed)
Subjective:    Misty Gomez is a 65 y.o. female who presents for a Welcome to Medicare exam.   She saw her eye doctor Dr. Lorina Rabon recently and he recommended having thyroid-stimulating hormone immunoglobulin collected with her labs today.  She is compliant with her thyroid replacement medicine.  She has been utilizing her muscle relaxer and Celebrex for back and hip pain.  Physical therapy and massage therapy have done wonders.  She has not returned to her orthopedic surgeon as a result  Review of Systems Positive for right hip pain, need for glasses and chronic constipation Cardiac Risk Factors include: advanced age (>29mn, >>79women)      Objective:    Today's Vitals   04/01/21 0900  BP: 125/80  Pulse: (!) 57  Temp: 97.8 F (36.6 C)  SpO2: 99%  Weight: 193 lb 12.8 oz (87.9 kg)  Height: '5\' 6"'  (1.676 m)  Body mass index is 31.28 kg/m.  Medications Outpatient Encounter Medications as of 04/01/2021  Medication Sig   albuterol (VENTOLIN HFA) 108 (90 Base) MCG/ACT inhaler Inhale 1-2 puffs into the lungs every 4 (four) hours as needed for wheezing or shortness of breath.   levothyroxine (SYNTHROID) 125 MCG tablet TAKE 1 TABLET BY MOUTH EVERY DAY   methocarbamol (ROBAXIN) 500 MG tablet Take 0.5-1 tablets (250-500 mg total) by mouth every 6 (six) hours as needed for muscle spasms.   omeprazole (PRILOSEC) 20 MG capsule Take 20 mg by mouth daily.   No facility-administered encounter medications on file as of 04/01/2021.     History: Past Medical History:  Diagnosis Date   Anemia    Arthritis    Asthma    GERD (gastroesophageal reflux disease)    Hypothyroidism    Thyroid eye disease 2017   Vitamin D deficiency    Past Surgical History:  Procedure Laterality Date   BARTHOLIN GLAND CYST EXCISION     COLONOSCOPY N/A 01/06/2017   Procedure: COLONOSCOPY;  Surgeon: RRogene Houston MD;  Location: [facility];  Service: Endoscopy;  Laterality: N/A;  930   POLYPECTOMY   01/06/2017   Procedure: POLYPECTOMY;  Surgeon: RRogene Houston MD;  Location: [facility];  Service: Endoscopy;;  colon    Family History  Problem Relation Age of Onset   CVA Mother 666  Rheum arthritis Mother    Heart attack Mother 684  Heart disease Mother    Heart attack Father 432      had first heart attack in early 464s  Heart disease Father    Cancer Brother    Heart disease Brother    Cancer Brother    Diabetes Brother    Hypertension Brother    Pancreatic disease Maternal Grandmother    Diabetes Maternal Grandmother    Asthma Maternal Grandfather    Heart attack Maternal Grandfather    Heart attack Paternal Grandmother    Heart attack Paternal Grandfather    Social History   Occupational History   Not on file  Tobacco Use   Smoking status: Never   Smokeless tobacco: Never  Vaping Use   Vaping Use: Never used  Substance and Sexual Activity   Alcohol use: No   Drug use: No   Sexual activity: Not Currently    Tobacco Counseling Counseling given: Not Answered   Immunizations and Health Maintenance Immunization History  Administered Date(s) Administered   Fluad Quad(high Dose 65+) 03/30/2021   Influenza-Unspecified 05/31/2018   MMR 02/23/2008, 03/22/2008  Moderna Sars-Covid-2 Vaccination 06/19/2019, 07/17/2019, 04/05/2020, 01/05/2021   Tdap 03/18/2020   Health Maintenance Due  Topic Date Due   Pneumonia Vaccine 47+ Years old (1 - PCV) Never done    Activities of Daily Living In your present state of health, do you have any difficulty performing the following activities: 04/01/2021  Hearing? Y  Comment wears hearing aids  Vision? N  Difficulty concentrating or making decisions? N  Walking or climbing stairs? N  Dressing or bathing? N  Doing errands, shopping? N  Preparing Food and eating ? N  Using the Toilet? N  In the past six months, have you accidently leaked urine? N  Do you have problems with loss of bowel control? N  Managing your  Medications? N  Managing your Finances? N  Housekeeping or managing your Housekeeping? N  Some recent data might be hidden    Physical Exam   General: Well-appearing, well-nourished female HEENT: Wears hearing aids.  TMs intact bilaterally.  Moist mucous membranes. Neck: No cervical lymphadenopathy Cardio: Regular rate and rhythm.  S1-S2 heard Pulmonary: Clear to auscultation bilaterally.  Normal work of breathing on room air Abdominal: Soft, nontender, nondistended.  No hepatosplenomegaly Extremities: Warm, well perfused.  No edema Musculoskeletal: Minimally antalgic gait.  Ambulating independently Psych: Mood stable, speech normal, affect appropriate Neuro: No focal neurologic deficits  Advanced Directives: Does Patient Have a Medical Advance Directive?: Yes Type of Advance Directive: Out of facility DNR (pink MOST or yellow form), Living will    Assessment:    This is a routine wellness examination for this patient .  Vision/Hearing screen Wears hearing aids and glasses.  Dietary issues and exercise activities discussed:  Current Exercise Habits: Home exercise routine, Type of exercise: Other - see comments, Exercise limited by: orthopedic condition(s)   Goals      Exercise 150 min/wk Moderate Activity       Depression Screen PHQ 2/9 Scores 04/01/2021 02/09/2021 03/18/2020 03/13/2019  PHQ - 2 Score 0 1 0 0  PHQ- 9 Score 1 3 0 0     Fall Risk Fall Risk  04/01/2021  Falls in the past year? 0  Number falls in past yr: -  Injury with Fall? -  Risk for fall due to : -  Follow up -    Cognitive Function: MMSE - Mini Mental State Exam 04/01/2021  Orientation to time 5  Orientation to Place 5  Registration 3  Attention/ Calculation 5  Recall 3  Language- name 2 objects 2  Language- repeat 1  Language- follow 3 step command 3  Language- read & follow direction 1  Write a sentence 1  Copy design 1  Total score 30        Patient Care Team: Janora Norlander, DO as PCP - General (Family Medicine)     Plan:   Welcome to Medicare preventive visit  DNR (do not resuscitate)  Chronic right-sided low back pain without sciatica  Chronic right hip pain  Postablative hypothyroidism - Plan: CMP14+EGFR, TSH, T4, Free, DG [facility] DEXA, Thyroid Stimulating Immunoglobulin  Mixed hyperlipidemia - Plan: CMP14+EGFR, Lipid Panel  Vitamin D deficiency - Plan: CMP14+EGFR, VITAMIN D 25 Hydroxy (Vit-D Deficiency, Fractures), DG [facility] DEXA  Low mean corpuscular volume (MCV) - Plan: CBC, Ferritin  Osteopenia of lumbar spine - Plan: VITAMIN D 25 Hydroxy (Vit-D Deficiency, Fractures), DG [facility] DEXA  DNR form copied and returned to patient.  She will bring her advance directive  Back pain and hip  pain are improving with physical therapy and massage therapy.  Check nonfasting labs  DEXA scan ordered for follow-up on osteopenia.  Check vitamin D given history of vitamin D deficiency  CBC and ferritin ordered given history of low MCV  I have personally reviewed and noted the following in the patient's chart:   Medical and social history Use of alcohol, tobacco or illicit drugs  Current medications and supplements Functional ability and status Nutritional status Physical activity Advanced directives List of other physicians Hospitalizations, surgeries, and ER visits in previous 12 months Vitals Screenings to include cognitive, depression, and falls Referrals and appointments  In addition, I have reviewed and discussed with patient certain preventive protocols, quality metrics, and best practice recommendations. A written personalized care plan for preventive services as well as general preventive health recommendations were provided to patient.     Ronnie Doss, DO 04/01/2021

## 2021-04-01 NOTE — Patient Instructions (Signed)
You had labs performed today.  You will be contacted with the results of the labs once they are available, usually in the next 3 business days for routine lab work.  If you have an active my chart account, they will be released to your MyChart.  If you prefer to have these labs released to you via telephone, please let us know.  If you had a pap smear or biopsy performed, expect to be contacted in about 7-10 days.  Thank you for coming in today for your Annual Medicare Wellness Visit.  Things that we discussed today are included in this packet.  Create and/or bring a copy of your Living Will/ Advanced Directive into the office so that we may respect your wishes should an emergency occur.  Get the recommended life-saving vaccines we discussed today.  Get your mammogram/ colonoscopy/ DEXA scans as directed by your provider.  Make sure that your medications are organized and safely stored.  Remember to always ask for help if you forget when/ how to take your medications.  Make healthy food choices (Rich in fruits/ veggies/ lean meats and low in salt, sugar and fat)  Do something that you enjoy for at least 30 minutes every day to stay active (walking, gardening, swimming, etc). This will help you lower your risk of falls/ broken bones.  Be social, do puzzles/ crosswords.  These things help the mind stay young and lower your risk of developing dementia.  Make sure that your home is safe by checking your smoke detectors regularly and doing the things outlined below to lower your risk of falls.  Preventive Care 25 Years and Older, Female Preventive care refers to lifestyle choices and visits with your health care provider that can promote health and wellness. This includes: A yearly physical exam. This is also called an annual wellness visit. Regular dental and eye exams. Immunizations. Screening for certain conditions. Healthy lifestyle choices, such as: Eating a healthy diet. Getting  regular exercise. Not using drugs or products that contain nicotine and tobacco. Limiting alcohol use. What can I expect for my preventive care visit? Physical exam Your health care provider will check your: Height and weight. These may be used to calculate your BMI (body mass index). BMI is a measurement that tells if you are at a healthy weight. Heart rate and blood pressure. Body temperature. Skin for abnormal spots. Counseling Your health care provider may ask you questions about your: Past medical problems. Family's medical history. Alcohol, tobacco, and drug use. Emotional well-being. Home life and relationship well-being. Sexual activity. Diet, exercise, and sleep habits. History of falls. Memory and ability to understand (cognition). Work and work Statistician. Pregnancy and menstrual history. Access to firearms. What immunizations do I need? Vaccines are usually given at various ages, according to a schedule. Your health care provider will recommend vaccines for you based on your age, medical history, and lifestyle or other factors, such as travel or where you work. What tests do I need? Blood tests Lipid and cholesterol levels. These may be checked every 5 years, or more often depending on your overall health. Hepatitis C test. Hepatitis B test. Screening Lung cancer screening. You may have this screening every year starting at age 63 if you have a 30-pack-year history of smoking and currently smoke or have quit within the past 15 years. Colorectal cancer screening. All adults should have this screening starting at age 68 and continuing until age 19. Your health care provider may recommend screening at  age 16 if you are at increased risk. You will have tests every 1-10 years, depending on your results and the type of screening test. Diabetes screening. This is done by checking your blood sugar (glucose) after you have not eaten for a while (fasting). You may have this  done every 1-3 years. Mammogram. This may be done every 1-2 years. Talk with your health care provider about how often you should have regular mammograms. Abdominal aortic aneurysm (AAA) screening. You may need this if you are a current or former smoker. BRCA-related cancer screening. This may be done if you have a family history of breast, ovarian, tubal, or peritoneal cancers. Other tests STD (sexually transmitted disease) testing, if you are at risk. Bone density scan. This is done to screen for osteoporosis. You may have this done starting at age 97. Talk with your health care provider about your test results, treatment options, and if necessary, the need for more tests. Follow these instructions at home: Eating and drinking  Eat a diet that includes fresh fruits and vegetables, whole grains, lean protein, and low-fat dairy products. Limit your intake of foods with high amounts of sugar, saturated fats, and salt. Take vitamin and mineral supplements as recommended by your health care provider. Do not drink alcohol if your health care provider tells you not to drink. If you drink alcohol: Limit how much you have to 0-1 drink a day. Be aware of how much alcohol is in your drink. In the [place] one drink equals one 12 oz bottle of beer (355 mL), one 5 oz glass of wine (148 mL), or one 1 oz glass of hard liquor (44 mL). Lifestyle Take daily care of your teeth and gums. Brush your teeth every morning and night with fluoride toothpaste. Floss one time each day. Stay active. Exercise for at least 30 minutes 5 or more days each week. Do not use any products that contain nicotine or tobacco, such as cigarettes, e-cigarettes, and chewing tobacco. If you need help quitting, ask your health care provider. Do not use drugs. If you are sexually active, practice safe sex. Use a condom or other form of protection in order to prevent STIs (sexually transmitted infections). Talk with your health care  provider about taking a low-dose aspirin or statin. Find healthy ways to cope with stress, such as: Meditation, yoga, or listening to music. Journaling. Talking to a trusted person. Spending time with friends and family. Safety Always wear your seat belt while driving or riding in a vehicle. Do not drive: If you have been drinking alcohol. Do not ride with someone who has been drinking. When you are tired or distracted. While texting. Wear a helmet and other protective equipment during sports activities. If you have firearms in your house, make sure you follow all gun safety procedures. What's next? Visit your health care provider once a year for an annual wellness visit. Ask your health care provider how often you should have your eyes and teeth checked. Stay up to date on all vaccines. This information is not intended to replace advice given to you by your health care provider. Make sure you discuss any questions you have with your health care provider. Document Revised: 08/08/2020 Document Reviewed: 05/25/2018 Elsevier Patient Education  2022 [employer].

## 2021-04-02 ENCOUNTER — Encounter: Payer: BLUE CROSS/BLUE SHIELD | Admitting: Physical Therapy

## 2021-04-03 LAB — LIPID PANEL
Chol/HDL Ratio: 3.7 ratio (ref 0.0–4.4)
Cholesterol, Total: 249 mg/dL — ABNORMAL HIGH (ref 100–199)
HDL: 67 mg/dL (ref >39)
LDL Chol Calc (NIH): 170 mg/dL — ABNORMAL HIGH (ref 0–99)
Triglycerides: 72 mg/dL (ref 0–149)
VLDL Cholesterol Cal: 12 mg/dL (ref 5–40)

## 2021-04-03 LAB — TSH: TSH: 0.595 u[IU]/mL (ref 0.450–4.500)

## 2021-04-03 LAB — CMP14+EGFR
ALT: 16 IU/L (ref 0–32)
AST: 27 IU/L (ref 0–40)
Albumin/Globulin Ratio: 1.7 (ref 1.2–2.2)
Albumin: 4 g/dL (ref 3.8–4.8)
Alkaline Phosphatase: 74 IU/L (ref 44–121)
BUN/Creatinine Ratio: 21 (ref 12–28)
BUN: 14 mg/dL (ref 8–27)
Bilirubin Total: 0.2 mg/dL (ref 0.0–1.2)
CO2: 21 mmol/L (ref 20–29)
Calcium: 9.3 mg/dL (ref 8.7–10.3)
Chloride: 105 mmol/L (ref 96–106)
Creatinine, Ser: 0.67 mg/dL (ref 0.57–1.00)
Globulin, Total: 2.3 g/dL (ref 1.5–4.5)
Glucose: 88 mg/dL (ref 70–99)
Potassium: 4.7 mmol/L (ref 3.5–5.2)
Sodium: 140 mmol/L (ref 134–144)
Total Protein: 6.3 g/dL (ref 6.0–8.5)
eGFR: 97 mL/min/{1.73_m2} (ref >59)

## 2021-04-03 LAB — CBC
Hematocrit: 35.4 % (ref 34.0–46.6)
Hemoglobin: 11.4 g/dL (ref 11.1–15.9)
MCH: 25.6 pg — ABNORMAL LOW (ref 26.6–33.0)
MCHC: 32.2 g/dL (ref 31.5–35.7)
MCV: 80 fL (ref 79–97)
Platelets: 248 10*3/uL (ref 150–450)
RBC: 4.45 x10E6/uL (ref 3.77–5.28)
RDW: 14 % (ref 11.7–15.4)
WBC: 4 10*3/uL (ref 3.4–10.8)

## 2021-04-03 LAB — FERRITIN: Ferritin: 8 ng/mL — ABNORMAL LOW (ref 15–150)

## 2021-04-03 LAB — VITAMIN D 25 HYDROXY (VIT D DEFICIENCY, FRACTURES): Vit D, 25-Hydroxy: 22.5 ng/mL — ABNORMAL LOW (ref 30.0–100.0)

## 2021-04-03 LAB — T4, FREE: Free T4: 1.51 ng/dL (ref 0.82–1.77)

## 2021-04-03 LAB — THYROID STIMULATING IMMUNOGLOBULIN: Thyroid Stim Immunoglobulin: 29.7 IU/L — ABNORMAL HIGH (ref 0.00–0.55)

## 2021-04-15 ENCOUNTER — Ambulatory Visit: Payer: Medicare PPO | Admitting: Physical Therapy

## 2021-04-29 DIAGNOSIS — H0100B Unspecified blepharitis left eye, upper and lower eyelids: Secondary | ICD-10-CM | POA: Diagnosis not present

## 2021-04-29 DIAGNOSIS — H0100A Unspecified blepharitis right eye, upper and lower eyelids: Secondary | ICD-10-CM | POA: Diagnosis not present

## 2021-04-29 DIAGNOSIS — H2513 Age-related nuclear cataract, bilateral: Secondary | ICD-10-CM | POA: Diagnosis not present

## 2021-04-29 DIAGNOSIS — E079 Disorder of thyroid, unspecified: Secondary | ICD-10-CM | POA: Diagnosis not present

## 2021-05-10 ENCOUNTER — Encounter: Payer: Self-pay | Admitting: Family Medicine

## 2021-05-11 ENCOUNTER — Other Ambulatory Visit: Payer: Self-pay | Admitting: Family Medicine

## 2021-05-11 DIAGNOSIS — G8929 Other chronic pain: Secondary | ICD-10-CM

## 2021-05-11 NOTE — Telephone Encounter (Signed)
If OTC advil ( 4 200mg  tablets ) is not helping a rx will be the same. The 800mg  prescription is the same as 4 200mg  tablets. If you need something stronger then that you will need to see your PCP.

## 2021-05-12 MED ORDER — METHOCARBAMOL 500 MG PO TABS: 250.0000 mg | ORAL_TABLET | Freq: Four times a day (QID) | ORAL | 0 refills | Status: DC | PRN

## 2021-05-14 ENCOUNTER — Ambulatory Visit (INDEPENDENT_AMBULATORY_CARE_PROVIDER_SITE_OTHER): Payer: Medicare PPO

## 2021-05-14 DIAGNOSIS — E890 Postprocedural hypothyroidism: Secondary | ICD-10-CM

## 2021-05-14 DIAGNOSIS — M8588 Other specified disorders of bone density and structure, other site: Secondary | ICD-10-CM

## 2021-05-14 DIAGNOSIS — M7989 Other specified soft tissue disorders: Secondary | ICD-10-CM | POA: Diagnosis not present

## 2021-05-14 DIAGNOSIS — E559 Vitamin D deficiency, unspecified: Secondary | ICD-10-CM | POA: Diagnosis not present

## 2021-05-19 ENCOUNTER — Other Ambulatory Visit (HOSPITAL_COMMUNITY): Payer: Self-pay | Admitting: Family Medicine

## 2021-05-22 DIAGNOSIS — M25551 Pain in right hip: Secondary | ICD-10-CM | POA: Diagnosis not present

## 2021-05-22 DIAGNOSIS — M7061 Trochanteric bursitis, right hip: Secondary | ICD-10-CM | POA: Diagnosis not present

## 2021-05-25 DIAGNOSIS — E0500 Thyrotoxicosis with diffuse goiter without thyrotoxic crisis or storm: Secondary | ICD-10-CM | POA: Diagnosis not present

## 2021-05-25 DIAGNOSIS — H532 Diplopia: Secondary | ICD-10-CM | POA: Diagnosis not present

## 2021-05-29 ENCOUNTER — Other Ambulatory Visit: Payer: Self-pay | Admitting: Orthopedic Surgery

## 2021-05-29 ENCOUNTER — Other Ambulatory Visit (HOSPITAL_COMMUNITY): Payer: Self-pay | Admitting: Orthopedic Surgery

## 2021-05-29 DIAGNOSIS — M25551 Pain in right hip: Secondary | ICD-10-CM

## 2021-06-09 ENCOUNTER — Other Ambulatory Visit: Payer: Self-pay | Admitting: Family Medicine

## 2021-06-09 MED ORDER — ALBUTEROL SULFATE HFA 108 (90 BASE) MCG/ACT IN AERS: 1.0000 | INHALATION_SPRAY | RESPIRATORY_TRACT | 0 refills | Status: DC | PRN

## 2021-06-11 ENCOUNTER — Ambulatory Visit (HOSPITAL_COMMUNITY)
Admission: RE | Admit: 2021-06-11 | Discharge: 2021-06-11 | Disposition: A | Discharge status: Home / Self Care | Payer: Medicare PPO | Source: Ambulatory Visit | Attending: Orthopedic Surgery | Admitting: Orthopedic Surgery

## 2021-06-11 ENCOUNTER — Encounter (HOSPITAL_COMMUNITY): Payer: Self-pay

## 2021-06-11 ENCOUNTER — Other Ambulatory Visit: Payer: Self-pay

## 2021-06-11 DIAGNOSIS — M25551 Pain in right hip: Secondary | ICD-10-CM

## 2021-06-11 DIAGNOSIS — G8929 Other chronic pain: Secondary | ICD-10-CM | POA: Diagnosis not present

## 2021-06-11 DIAGNOSIS — S73191A Other sprain of right hip, initial encounter: Secondary | ICD-10-CM | POA: Diagnosis not present

## 2021-06-11 MED ORDER — IOHEXOL 180 MG/ML  SOLN
20.0000 mL | Freq: Once | INTRAMUSCULAR | Status: AC | PRN
  Administered 2021-06-11: 10:00:00 10 mL via INTRATHECAL

## 2021-06-11 MED ORDER — SODIUM CHLORIDE (PF) 0.9 % IJ SOLN
INTRAMUSCULAR | Status: AC
  Administered 2021-06-11: 09:00:00 5 mL
  Filled 2021-06-11: qty 10

## 2021-06-11 MED ORDER — SODIUM CHLORIDE FLUSH 0.9 % IV SOLN
INTRAVENOUS | Status: AC
  Filled 2021-06-11: qty 10

## 2021-06-11 MED ORDER — LIDOCAINE HCL (PF) 1 % IJ SOLN
INTRAMUSCULAR | Status: AC
  Administered 2021-06-11: 10:00:00 5 mL
  Filled 2021-06-11: qty 5

## 2021-06-11 MED ORDER — GADOBUTROL 1 MMOL/ML IV SOLN
2.0000 mL | Freq: Once | INTRAVENOUS | Status: AC | PRN
  Administered 2021-06-11: 10:00:00 0.02 mL

## 2021-06-11 MED ORDER — POVIDONE-IODINE 10 % EX SOLN
Freq: Once | CUTANEOUS | Status: AC
  Administered 2021-06-11: 1 via TOPICAL

## 2021-06-11 NOTE — Procedures (Signed)
CLINICAL DATA: Chronic right hip pain for 6 months. EXAM: RIGHT HIP CONTRAST INJECTION UNDER FLUOROSCOPY, PRE MRI  COMPARISON: Radiographs 12/09/2020 FLUOROSCOPY TIME: Fluoroscopy Time:  0 minutes, 42 seconds Radiation Exposure Index (if provided by the fluoroscopic device):  5.3 mGy  Number of Acquired Spot Images: 0 PROCEDURE:  I discussed the risks (including hemorrhage, infection, and allergic reaction, among others), benefits, and alternatives to the procedure with the patient.  We specifically discussed the high technical likelihood of success of the procedure.  The patient understood and elected to undergo the procedure.    Standard time-out was employed.  Following sterile skin prep and local anesthetic administration consisting of 1% lidocaine, a 22 gauge needle was advanced without difficulty into the right hip joint under fluoroscopic guidance.  A total of 11 cc of a combination of 10 cc Omnipaque 180, 10 cc of sterile saline, and 0.02 cc of Gadavist was injected into the joint.  The needle was subsequently removed and the skin cleansed and bandaged.  No immediate complications were observed.    IMPRESSION: Technically successful right hip contrast injection under fluoroscopy.

## 2021-06-12 ENCOUNTER — Encounter: Payer: Self-pay | Admitting: Family Medicine

## 2021-06-20 ENCOUNTER — Other Ambulatory Visit: Payer: Self-pay | Admitting: Family Medicine

## 2021-06-22 DIAGNOSIS — M1611 Unilateral primary osteoarthritis, right hip: Secondary | ICD-10-CM | POA: Diagnosis not present

## 2021-07-31 ENCOUNTER — Other Ambulatory Visit: Payer: Medicare PPO

## 2021-07-31 DIAGNOSIS — Z5181 Encounter for therapeutic drug level monitoring: Secondary | ICD-10-CM

## 2021-07-31 DIAGNOSIS — Z79899 Other long term (current) drug therapy: Secondary | ICD-10-CM | POA: Diagnosis not present

## 2021-08-01 LAB — CBC WITH DIFFERENTIAL/PLATELET
Basophils Absolute: 0 10*3/uL (ref 0.0–0.2)
Basos: 1 %
EOS (ABSOLUTE): 0.2 10*3/uL (ref 0.0–0.4)
Eos: 4 %
Hematocrit: 35 % (ref 34.0–46.6)
Hemoglobin: 11 g/dL — ABNORMAL LOW (ref 11.1–15.9)
Immature Grans (Abs): 0 10*3/uL (ref 0.0–0.1)
Immature Granulocytes: 0 %
Lymphocytes Absolute: 1.8 10*3/uL (ref 0.7–3.1)
Lymphs: 34 %
MCH: 25.3 pg — ABNORMAL LOW (ref 26.6–33.0)
MCHC: 31.4 g/dL — ABNORMAL LOW (ref 31.5–35.7)
MCV: 81 fL (ref 79–97)
Monocytes Absolute: 0.5 10*3/uL (ref 0.1–0.9)
Monocytes: 9 %
Neutrophils Absolute: 2.8 10*3/uL (ref 1.4–7.0)
Neutrophils: 52 %
Platelets: 234 10*3/uL (ref 150–450)
RBC: 4.35 x10E6/uL (ref 3.77–5.28)
RDW: 14.2 % (ref 11.7–15.4)
WBC: 5.3 10*3/uL (ref 3.4–10.8)

## 2021-08-01 LAB — CMP14+EGFR
ALT: 16 IU/L (ref 0–32)
AST: 25 IU/L (ref 0–40)
Albumin/Globulin Ratio: 1.9 (ref 1.2–2.2)
Albumin: 4.1 g/dL (ref 3.8–4.8)
Alkaline Phosphatase: 83 IU/L (ref 44–121)
BUN/Creatinine Ratio: 21 (ref 12–28)
BUN: 16 mg/dL (ref 8–27)
Bilirubin Total: 0.3 mg/dL (ref 0.0–1.2)
CO2: 22 mmol/L (ref 20–29)
Calcium: 9.1 mg/dL (ref 8.7–10.3)
Chloride: 109 mmol/L — ABNORMAL HIGH (ref 96–106)
Creatinine, Ser: 0.76 mg/dL (ref 0.57–1.00)
Globulin, Total: 2.2 g/dL (ref 1.5–4.5)
Glucose: 125 mg/dL — ABNORMAL HIGH (ref 70–99)
Potassium: 4.5 mmol/L (ref 3.5–5.2)
Sodium: 144 mmol/L (ref 134–144)
Total Protein: 6.3 g/dL (ref 6.0–8.5)
eGFR: 86 mL/min/{1.73_m2} (ref >59)

## 2021-08-01 LAB — PROTIME-INR
INR: 1 (ref 0.9–1.2)
Prothrombin Time: 10.1 s (ref 9.1–12.0)

## 2021-08-17 DIAGNOSIS — M1611 Unilateral primary osteoarthritis, right hip: Secondary | ICD-10-CM | POA: Diagnosis not present

## 2021-09-22 DIAGNOSIS — Z01118 Encounter for examination of ears and hearing with other abnormal findings: Secondary | ICD-10-CM | POA: Diagnosis not present

## 2021-09-22 DIAGNOSIS — H903 Sensorineural hearing loss, bilateral: Secondary | ICD-10-CM | POA: Diagnosis not present

## 2021-09-30 DIAGNOSIS — Z471 Aftercare following joint replacement surgery: Secondary | ICD-10-CM | POA: Diagnosis not present

## 2021-09-30 DIAGNOSIS — Z96641 Presence of right artificial hip joint: Secondary | ICD-10-CM | POA: Diagnosis not present

## 2021-09-30 DIAGNOSIS — Z4789 Encounter for other orthopedic aftercare: Secondary | ICD-10-CM | POA: Diagnosis not present

## 2021-10-26 DIAGNOSIS — Z1231 Encounter for screening mammogram for malignant neoplasm of breast: Secondary | ICD-10-CM | POA: Diagnosis not present

## 2021-12-03 DIAGNOSIS — E0500 Thyrotoxicosis with diffuse goiter without thyrotoxic crisis or storm: Secondary | ICD-10-CM | POA: Diagnosis not present

## 2021-12-21 DIAGNOSIS — E0500 Thyrotoxicosis with diffuse goiter without thyrotoxic crisis or storm: Secondary | ICD-10-CM | POA: Diagnosis not present

## 2021-12-21 DIAGNOSIS — H532 Diplopia: Secondary | ICD-10-CM | POA: Diagnosis not present

## 2021-12-24 DIAGNOSIS — E0500 Thyrotoxicosis with diffuse goiter without thyrotoxic crisis or storm: Secondary | ICD-10-CM | POA: Diagnosis not present

## 2022-01-14 DIAGNOSIS — E0500 Thyrotoxicosis with diffuse goiter without thyrotoxic crisis or storm: Secondary | ICD-10-CM | POA: Diagnosis not present

## 2022-01-22 ENCOUNTER — Encounter: Payer: Self-pay | Admitting: Nurse Practitioner

## 2022-01-22 ENCOUNTER — Ambulatory Visit: Payer: Medicare PPO | Admitting: Nurse Practitioner

## 2022-01-22 VITALS — BP 137/82 | HR 58 | Temp 99.1°F | Ht 66.0 in | Wt 181.0 lb

## 2022-01-22 DIAGNOSIS — R052 Subacute cough: Secondary | ICD-10-CM | POA: Diagnosis not present

## 2022-01-22 MED ORDER — BENZONATATE 100 MG PO CAPS: 100.0000 mg | ORAL_CAPSULE | Freq: Three times a day (TID) | ORAL | 0 refills | Status: DC | PRN

## 2022-01-22 MED ORDER — PREDNISONE 20 MG PO TABS
20.0000 mg | ORAL_TABLET | Freq: Every day | ORAL | 0 refills | Status: DC
Start: 2022-01-22 — End: 2022-03-24

## 2022-01-22 NOTE — Patient Instructions (Signed)

## 2022-01-22 NOTE — Progress Notes (Signed)
Acute Office Visit  Subjective:     Patient ID: Misty Gomez, female    DOB: 12/04/1955, 66 y.o.   MRN: [id number]  Chief Complaint  Patient presents with   Cough    Pt states this has been going on for 3 weeks now , states this has been going on since it was really hot for a week     Cough This is a recurrent problem. The current episode started 1 to 4 weeks ago. The problem has been unchanged. The problem occurs constantly. The cough is Non-productive. Pertinent negatives include no chills, ear congestion, fever, heartburn, nasal congestion or rash. Nothing aggravates the symptoms. She has tried nothing for the symptoms. Her past medical history is significant for asthma.     Review of Systems  Constitutional: Negative.  Negative for chills, fever and malaise/fatigue.  HENT: Negative.  Negative for congestion and sinus pain.   Eyes: Negative.   Respiratory:  Positive for cough.   Cardiovascular: Negative.   Gastrointestinal:  Negative for heartburn.  Genitourinary: Negative.   Skin: Negative.  Negative for itching and rash.  All other systems reviewed and are negative.       Objective:    BP 137/82   Pulse (!) 58   Temp 99.1 F (37.3 C)   Ht '5\' 6"'$  (1.676 m)   Wt 181 lb (82.1 kg)   SpO2 94%   BMI 29.21 kg/m  BP Readings from Last 3 Encounters:  01/22/22 137/82  06/11/21 (!) 157/83  04/01/21 125/80   Wt Readings from Last 3 Encounters:  01/22/22 181 lb (82.1 kg)  04/01/21 193 lb 12.8 oz (87.9 kg)  02/09/21 189 lb 3.2 oz (85.8 kg)      Physical Exam Vitals and nursing note reviewed.  Constitutional:      Appearance: Normal appearance.  HENT:     Head: Normocephalic.     Right Ear: External ear normal.     Left Ear: External ear normal.     Nose: Nose normal. No congestion.     Mouth/Throat:     Mouth: Mucous membranes are moist.  Eyes:     Conjunctiva/sclera: Conjunctivae normal.  Cardiovascular:     Rate and Rhythm: Normal rate and regular  rhythm.     Pulses: Normal pulses.     Heart sounds: Normal heart sounds.  Pulmonary:     Effort: Pulmonary effort is normal. No respiratory distress.     Breath sounds: Normal breath sounds. No wheezing.     Comments: Cough present Chest:     Chest wall: No tenderness.  Abdominal:     General: Bowel sounds are normal.     Tenderness: There is no right CVA tenderness or left CVA tenderness.  Musculoskeletal:        General: Normal range of motion.  Skin:    General: Skin is warm.  Neurological:     General: No focal deficit present.     Mental Status: She is alert and oriented to person, place, and time.  Psychiatric:        Mood and Affect: Mood normal.        Behavior: Behavior normal.     No results found for any visits on 01/22/22.      Assessment & Plan:  Take meds as prescribed - Use a cool mist humidifier  -Use saline nose sprays frequently -Force fluids -Tessalon pearls for cough and congestion -continue Albuterol -prednisone 20 mg tablet by mouth daily  with breakfast for 6 days -For fever or aches or pains- take Tylenol or ibuprofen. -If symptoms do not improve, she may need to be COVID tested to rule this out Follow up with worsening unresolved symptoms  Problem List Items Addressed This Visit   None Visit Diagnoses     Subacute cough    -  Primary   Relevant Medications   predniSONE (DELTASONE) 20 MG tablet   benzonatate (TESSALON PERLES) 100 MG capsule       Meds ordered this encounter  Medications   predniSONE (DELTASONE) 20 MG tablet    Sig: Take 1 tablet (20 mg total) by mouth daily with breakfast.    Dispense:  6 tablet    Refill:  0    Order Specific Question:   Supervising Provider    Answer:   Jeneen Rinks   benzonatate (TESSALON PERLES) 100 MG capsule    Sig: Take 1 capsule (100 mg total) by mouth 3 (three) times daily as needed.    Dispense:  20 capsule    Refill:  0    Order Specific Question:   Supervising Provider     Answer:   Claretta Fraise [[id number]]    Return if symptoms worsen or fail to improve.  Ivy Lynn, NP

## 2022-02-04 DIAGNOSIS — E0500 Thyrotoxicosis with diffuse goiter without thyrotoxic crisis or storm: Secondary | ICD-10-CM | POA: Diagnosis not present

## 2022-02-25 DIAGNOSIS — E0500 Thyrotoxicosis with diffuse goiter without thyrotoxic crisis or storm: Secondary | ICD-10-CM | POA: Diagnosis not present

## 2022-03-18 ENCOUNTER — Other Ambulatory Visit: Payer: Self-pay | Admitting: Family Medicine

## 2022-03-18 DIAGNOSIS — E0500 Thyrotoxicosis with diffuse goiter without thyrotoxic crisis or storm: Secondary | ICD-10-CM | POA: Diagnosis not present

## 2022-03-24 ENCOUNTER — Ambulatory Visit: Payer: Medicare PPO | Admitting: Nurse Practitioner

## 2022-03-24 ENCOUNTER — Encounter: Payer: Self-pay | Admitting: Nurse Practitioner

## 2022-03-24 VITALS — BP 146/89 | HR 97 | Temp 98.7°F | Ht 66.0 in | Wt 178.6 lb

## 2022-03-24 DIAGNOSIS — R052 Subacute cough: Secondary | ICD-10-CM

## 2022-03-24 DIAGNOSIS — H903 Sensorineural hearing loss, bilateral: Secondary | ICD-10-CM | POA: Diagnosis not present

## 2022-03-24 DIAGNOSIS — J069 Acute upper respiratory infection, unspecified: Secondary | ICD-10-CM

## 2022-03-24 DIAGNOSIS — Z01118 Encounter for examination of ears and hearing with other abnormal findings: Secondary | ICD-10-CM | POA: Diagnosis not present

## 2022-03-24 DIAGNOSIS — M25551 Pain in right hip: Secondary | ICD-10-CM | POA: Diagnosis not present

## 2022-03-24 MED ORDER — PREDNISONE 20 MG PO TABS: 20.0000 mg | ORAL_TABLET | Freq: Every day | ORAL | 0 refills | Status: DC

## 2022-03-24 MED ORDER — CETIRIZINE HCL 10 MG PO TABS: 10.0000 mg | ORAL_TABLET | Freq: Every day | ORAL | 11 refills | Status: AC

## 2022-03-24 NOTE — Progress Notes (Signed)
Acute Office Visit  Subjective:     Patient ID: Misty Gomez, female    DOB: 10-May-1956, 66 y.o.   MRN: [id number]  Chief Complaint  Patient presents with   Hip Pain    2 - 3 months     Hip Pain  The incident occurred more than 1 week ago. Incident location: hx right hip surgery. There was no injury mechanism. The pain is present in the right hip. The pain is at a severity of 6/10. The pain is moderate. The pain has been Constant since onset. She reports no foreign bodies present. She has tried nothing for the symptoms.  URI  This is a new problem. The current episode started yesterday. The problem has been unchanged. There has been no fever. Associated symptoms include congestion, coughing, joint pain and rhinorrhea. Pertinent negatives include no ear pain, headaches, rash, sinus pain or sore throat. She has tried decongestant and increased fluids for the symptoms. The treatment provided mild relief.     Review of Systems  Constitutional:  Negative for chills, fever and weight loss.  HENT:  Positive for congestion and rhinorrhea. Negative for ear pain, sinus pain and sore throat.   Eyes: Negative.   Respiratory:  Positive for cough.   Cardiovascular: Negative.   Musculoskeletal:  Positive for joint pain.  Skin: Negative.  Negative for itching and rash.  Neurological:  Negative for headaches.  All other systems reviewed and are negative.       Objective:    BP (!) 146/89   Pulse 97   Temp 98.7 F (37.1 C)   Ht '5\' 6"'$  (1.676 m)   Wt 178 lb 9.6 oz (81 kg)   SpO2 97%   BMI 28.83 kg/m  BP Readings from Last 3 Encounters:  03/24/22 (!) 146/89  01/22/22 137/82  06/11/21 (!) 157/83      Physical Exam Vitals and nursing note reviewed.  Constitutional:      Appearance: Normal appearance.  HENT:     Head: Normocephalic.     Right Ear: External ear normal.     Left Ear: External ear normal.     Nose: Congestion present.  Eyes:     Conjunctiva/sclera:  Conjunctivae normal.  Cardiovascular:     Rate and Rhythm: Normal rate and regular rhythm.     Pulses: Normal pulses.     Heart sounds: Normal heart sounds.  Pulmonary:     Effort: Pulmonary effort is normal.     Breath sounds: Normal breath sounds.  Abdominal:     General: Bowel sounds are normal.  Skin:    General: Skin is warm.     Findings: No erythema or rash.  Neurological:     General: No focal deficit present.     Mental Status: She is alert and oriented to person, place, and time.     No results found for any visits on 03/24/22.      Assessment & Plan:  Symptoms present as viral upper respiratory infection.  Educated patient to take meds as prescribed -Daily cetirizine to help with allergy rhinitis -Prednisone 20 mg tablet by mouth daily for 6 days - Use a cool mist humidifier  -Use saline nose sprays frequently -Force fluids -For fever or aches or pains- take Tylenol or ibuprofen. -If symptoms do not improve, she may need to be COVID tested to rule this out Follow up with worsening unresolved symptoms    For chronic hip pain I completed a referral to  orthopedic Continue symptom management as discussed in clinic.    Problem List Items Addressed This Visit   None Visit Diagnoses     Upper respiratory infection with cough and congestion    -  Primary   Relevant Medications   predniSONE (DELTASONE) 20 MG tablet   cetirizine (ZYRTEC) 10 MG tablet   Right hip pain       Relevant Orders   Ambulatory referral to Physical Therapy   Subacute cough           Meds ordered this encounter  Medications   predniSONE (DELTASONE) 20 MG tablet    Sig: Take 1 tablet (20 mg total) by mouth daily with breakfast.    Dispense:  6 tablet    Refill:  0    Order Specific Question:   Supervising Provider    Answer:   Claretta Fraise [[id number]]   cetirizine (ZYRTEC) 10 MG tablet    Sig: Take 1 tablet (10 mg total) by mouth daily.    Dispense:  30 tablet    Refill:  11     Order Specific Question:   Supervising Provider    Answer:   Claretta Fraise [phone]    Return if symptoms worsen or fail to improve.  Ivy Lynn, NP

## 2022-03-24 NOTE — Patient Instructions (Addendum)
Viral Respiratory Infection A respiratory infection is an illness that affects part of the respiratory system, such as the lungs, nose, or throat. A respiratory infection that is caused by a virus is called a viral respiratory infection. Common types of viral respiratory infections include: A cold. The flu (influenza). A respiratory syncytial virus (RSV) infection. What are the causes? This condition is caused by a virus. The virus may spread through contact with droplets or direct contact with infected people or their mucus or secretions. The virus may spread from person to person (is contagious). What are the signs or symptoms? Symptoms of this condition include: A stuffy or runny nose. A sore throat or cough. Shortness of breath or difficulty breathing. Yellow or green mucus (sputum). Other symptoms may include: A fever. Sweating or chills. Fatigue. Achy muscles. A headache. How is this diagnosed? This condition may be diagnosed based on: Your symptoms. A physical exam. Testing of secretions from the nose or throat. Chest X-ray. How is this treated? This condition may be treated with medicines, such as: Antiviral medicine. This may shorten the length of time a person has symptoms. Expectorants. These make it easier to cough up mucus. Decongestant nasal sprays. Acetaminophen or NSAIDs, such as ibuprofen, to relieve fever and pain. Antibiotic medicines are not prescribed for viral infections.This is because antibiotics are designed to kill bacteria. They do not kill viruses. Follow these instructions at home: Managing pain and congestion Take over-the-counter and prescription medicines only as told by your health care provider. If you have a sore throat, gargle with a mixture of salt and water 3-4 times a day or as needed. To make salt water, completely dissolve -1 tsp (3-6 g) of salt in 1 cup (237 mL) of warm water. Use nose drops made from salt water to ease congestion and  soften raw skin around your nose. Take 2 tsp (10 mL) of honey at bedtime to lessen coughing at night. Do not give honey to children who are younger than 1 year. Drink enough fluid to keep your urine pale yellow. This helps prevent dehydration and helps loosen up mucus. General instructions  Rest as much as possible. Do not drink alcohol. Do not use any products that contain nicotine or tobacco. These products include cigarettes, chewing tobacco, and vaping devices, such as e-cigarettes. If you need help quitting, ask your health care provider. Keep all follow-up visits. This is important. How is this prevented?     Get an annual flu shot. You may get the flu shot in late summer, fall, or winter. Ask your health care provider when you should get your flu shot. Avoid spreading your infection to other people. If you are sick: Wash your hands with soap and water often, especially after you cough or sneeze. Wash for at least 20 seconds. If soap and water are not available, use alcohol-based hand sanitizer. Cover your mouth when you cough. Cover your nose and mouth when you sneeze. Do not share cups or eating utensils. Clean commonly used objects often. Clean commonly touched surfaces. Stay home from work or school as told by your health care provider. Avoid contact with people who are sick during cold and flu season. This is generally fall and winter. Contact a health care provider if: Your symptoms last for 10 days or longer. Your symptoms get worse over time. You have severe sinus pain in your face or forehead. The glands in your jaw or neck become very swollen. You have shortness of breath. Get   help right away if you: Feel pain or pressure in your chest. Have trouble breathing. Faint or feel like you will faint. Have severe and persistent vomiting. Feel confused or disoriented. These symptoms may represent a serious problem that is an emergency. Do not wait to see if the symptoms will  go away. Get medical help right away. Call your local emergency services (911 in the [place] Do not drive yourself to the hospital. Summary A respiratory infection is an illness that affects part of the respiratory system, such as the lungs, nose, or throat. A respiratory infection that is caused by a virus is called a viral respiratory infection. Common types of viral respiratory infections include a cold, influenza, and respiratory syncytial virus (RSV) infection. Symptoms of this condition include a stuffy or runny nose, cough, fatigue, achy muscles, sore throat, and fevers or chills. Antibiotic medicines are not prescribed for viral infections. This is because antibiotics are designed to kill bacteria. They are not effective against viruses. This information is not intended to replace advice given to you by your health care provider. Make sure you discuss any questions you have with your health care provider. Document Revised: 09/04/2020 Document Reviewed: 09/04/2020 Elsevier Patient Education  2023 [employer]  

## 2022-03-25 ENCOUNTER — Encounter: Payer: Self-pay | Admitting: Nurse Practitioner

## 2022-03-25 MED ORDER — BENZONATATE 200 MG PO CAPS: 200.0000 mg | ORAL_CAPSULE | Freq: Three times a day (TID) | ORAL | 1 refills | Status: DC | PRN

## 2022-04-02 ENCOUNTER — Encounter: Payer: Medicare PPO | Admitting: Family Medicine

## 2022-04-07 ENCOUNTER — Other Ambulatory Visit: Payer: Self-pay

## 2022-04-07 ENCOUNTER — Ambulatory Visit: Payer: Medicare PPO | Attending: Nurse Practitioner | Admitting: Physical Therapy

## 2022-04-07 DIAGNOSIS — M79651 Pain in right thigh: Secondary | ICD-10-CM | POA: Insufficient documentation

## 2022-04-07 DIAGNOSIS — M25551 Pain in right hip: Secondary | ICD-10-CM | POA: Diagnosis not present

## 2022-04-07 DIAGNOSIS — M545 Low back pain, unspecified: Secondary | ICD-10-CM | POA: Insufficient documentation

## 2022-04-07 NOTE — Therapy (Signed)
OUTPATIENT PHYSICAL THERAPY LOWER EXTREMITY EVALUATION   Patient Name: Misty Gomez MRN: [id number] DOB:1956-03-17, 66 y.o., female Today's Date: 04/07/2022   PT End of Session - 04/07/22 1020     Visit Number 1    Number of Visits 12    Date for PT Re-Evaluation 07/06/22    Authorization Type FOTO AT LEAST EVERY 5TH VISIT.  PROGRESS NOTE AT 10TH VISIT.  KX MODIFIER AFTER 15 VISITS.    PT Start Time 0945    PT Stop Time 1036    PT Time Calculation (min) 51 min    Activity Tolerance Patient tolerated treatment well    Behavior During Therapy WFL for tasks assessed/performed             Past Medical History:  Diagnosis Date   Anemia    Arthritis    Asthma    GERD (gastroesophageal reflux disease)    Hypothyroidism    Thyroid eye disease 2017   Vitamin D deficiency    Past Surgical History:  Procedure Laterality Date   BARTHOLIN GLAND CYST EXCISION     COLONOSCOPY N/A 01/06/2017   Procedure: COLONOSCOPY;  Surgeon: Rogene Houston, MD;  [facility];  Service: Endoscopy;  Laterality: N/A;  930   POLYPECTOMY  01/06/2017   Procedure: POLYPECTOMY;  Surgeon: Rogene Houston, MD;  Location: [facility];  Service: Endoscopy;;  colon   Patient Active Problem List   Diagnosis Date Noted   Vitamin D deficiency 02/24/2018   Anemia 02/24/2018   Postablative hypothyroidism 02/24/2018   Osteopenia 02/24/2018   Special screening for malignant neoplasms, colon 09/09/2016   Other reasons for seeking consultation 12/06/2013     REFERRING PROVIDER: Jac Canavan  REFERRING DIAG: Left hip pain.  THERAPY DIAG:  Pain in right thigh  Rationale for Evaluation and Treatment Rehabilitation  ONSET DATE: 08/17/21.  SUBJECTIVE:   SUBJECTIVE STATEMENT: The patient presents to the clinic today s/p right hip and thigh pain that has been ongoing since her THA performed on 08/17/21.  Her pain is low today as she has taken Motrin and Tylenol.  There are times when the pain  becomes severe.  She reports numbness and extreme hypersensitivity over her right thigh.  Many things can increase her pain such as prolonged sitting, standing and walking.  She has multiple pain descriptors such as aching, sore, throbbing, sharp, numb and shooting. The patient is very active and exercises regularly and has had massages but her pain and symptoms are unrelenting.  PERTINENT HISTORY: Right THA PAIN:  Are you having pain? Yes: NPRS scale: 3/10 Pain location: Right hip and thigh. Pain description: As above. Aggravating factors: As above. Relieving factors: As above.  PRECAUTIONS: Anterior hip.  No ultrasound over right hip.  WEIGHT BEARING RESTRICTIONS: No  FALLS:  Has patient fallen in last 6 months? No  LIVING ENVIRONMENT: Lives with: lives with their spouse Lives in: House/apartment Has following equipment at home: None  OCCUPATION: Retired.  PLOF: Independent  PATIENT GOALS: Get back to her active lifestyle without hip pain.   OBJECTIVE:     SENSATION: Numb and hypersensitive over right lateral thigh and distal ITB.  PALPATION: Tender to palpation diffusely over her right anterior hip incisional site and moving distally and laterally to level of knee at ITB.  Very hypersensitive to even light touch.  LOWER EXTREMITY ROM:            WFL for right hip.  LOWER EXTREMITY MMT:  Normal right hip strength.   GAIT: Normal gait pattern.   TODAY'S TREATMENT                                                                          DATE:  04/07/22. HMP to patient's right thigh at 80-150 Hz on 40% scan x 20 minutes.  Patient tolerated treatment without complaint.    ASSESSMENT:  CLINICAL IMPRESSION: The patient presents to OPP[facility] with c/o, at times, severe right hip and thigh pain and significant hypersensitivity.  She underwent a right anterior total hip replacement on 08/17/21.  Her right hip range of motion is [facility] and her strength is normal.  Her pain and  symptoms are prohibiting her from performing activities as she did pre-morbidly.  Her gait this morning was WNL.  Multiple activites reproduce her pain and symptoms.  Patient will benefit from skilled physical therapy intervention to address pain and deficits.  OBJECTIVE IMPAIRMENTS: decreased activity tolerance, decreased mobility, impaired sensation, and pain.   ACTIVITY LIMITATIONS: sitting and standing  PARTICIPATION LIMITATIONS: cleaning  PERSONAL FACTORS: Time since onset of injury/illness/exacerbation and 1 comorbidity: THA  are also affecting patient's functional outcome.   REHAB POTENTIAL: Good  CLINICAL DECISION MAKING: Evolving/moderate complexity  EVALUATION COMPLEXITY: Moderate   GOALS:  SHORT TERM GOALS: Target date: 04/21/2022  Ind with a HEP. Baseline: Goal status: INITIAL  LONG TERM GOALS: Target date: 05/19/2022   Perform ADL's with pain not > a 3/10. Baseline:  Goal status: INITIAL  2.  Reduce hypersensitivity by a subjective rating of 50% Baseline:  Goal status: INITIAL  3.  Return to normal exercise regimen with pain not > 2-3/10. Baseline:  Goal status: INITIAL   PLAN:  PT FREQUENCY: 2x/week  PT DURATION: 6 weeks  PLANNED INTERVENTIONS: Therapeutic exercises, Therapeutic activity, Neuromuscular re-education, Patient/Family education, Self Care, Dry Needling, Electrical stimulation, Cryotherapy, Moist heat, scar mobilization, Vasopneumatic device, Ultrasound, Ionotophoresis '4mg'$ /ml Dexamethasone, and Manual therapy  PLAN FOR NEXT SESSION: FOTO.  Desensitization techniques, combo e'stim/US over right affected thigh (not hip), STW/M including IASTM, dry needling, recumbent bike/elliptical, knee extension machine.    Antwan Pandya, [place], PT 04/07/2022, 11:42 AM

## 2022-04-09 ENCOUNTER — Ambulatory Visit: Payer: Medicare PPO | Admitting: Physical Therapy

## 2022-04-09 ENCOUNTER — Encounter: Payer: Self-pay | Admitting: Physical Therapy

## 2022-04-09 DIAGNOSIS — M25551 Pain in right hip: Secondary | ICD-10-CM | POA: Diagnosis not present

## 2022-04-09 DIAGNOSIS — M545 Low back pain, unspecified: Secondary | ICD-10-CM | POA: Diagnosis not present

## 2022-04-09 DIAGNOSIS — M79651 Pain in right thigh: Secondary | ICD-10-CM

## 2022-04-09 NOTE — Therapy (Addendum)
OUTPATIENT PHYSICAL THERAPY LOWER EXTREMITYTREATMENT   Patient Name: Misty Gomez MRN: [id number] DOB:27-Oct-1955, 66 y.o., female Today's Date: 04/09/2022   PT End of Session - 04/09/22 1231     Visit Number 2    Number of Visits 12    Date for PT Re-Evaluation 07/06/22    Authorization Type FOTO AT LEAST EVERY 5TH VISIT.  PROGRESS NOTE AT 10TH VISIT.  KX MODIFIER AFTER 15 VISITS.    PT Start Time 0815    PT Stop Time 0907    PT Time Calculation (min) 52 min    Activity Tolerance Patient tolerated treatment well    Behavior During Therapy [facility] for tasks assessed/performed             Past Medical History:  Diagnosis Date   Anemia    Arthritis    Asthma    GERD (gastroesophageal reflux disease)    Hypothyroidism    Thyroid eye disease 2017   Vitamin D deficiency    Past Surgical History:  Procedure Laterality Date   BARTHOLIN GLAND CYST EXCISION     COLONOSCOPY N/A 01/06/2017   Procedure: COLONOSCOPY;  Surgeon: Rogene Houston, MD;  [facility];  Service: Endoscopy;  Laterality: N/A;  930   POLYPECTOMY  01/06/2017   Procedure: POLYPECTOMY;  Surgeon: Rogene Houston, MD;  [facility];  Service: Endoscopy;;  colon   Patient Active Problem List   Diagnosis Date Noted   Vitamin D deficiency 02/24/2018   Anemia 02/24/2018   Postablative hypothyroidism 02/24/2018   Osteopenia 02/24/2018   Special screening for malignant neoplasms, colon 09/09/2016   Other reasons for seeking consultation 12/06/2013     REFERRING PROVIDER: Jac Canavan  REFERRING DIAG: Left hip pain.  THERAPY DIAG:  Pain in right thigh  Acute right-sided low back pain without sciatica  Pain in right hip  Rationale for Evaluation and Treatment Rehabilitation  ONSET DATE: 08/17/21.  SUBJECTIVE:   SUBJECTIVE STATEMENT: Last treatment felt good.  PERTINENT HISTORY: Right THA PAIN:  Are you having pain? Yes: NPRS scale: 3/10 Pain location: Right hip and  thigh. Pain description: As above. Aggravating factors: As above. Relieving factors: As above.  PRECAUTIONS: Anterior hip.  No ultrasound over right hip.  WEIGHT BEARING RESTRICTIONS: No  FALLS:  Has patient fallen in last 6 months? No  LIVING ENVIRONMENT: Lives with: lives with their spouse Lives in: House/apartment Has following equipment at home: None  OCCUPATION: Retired.  PLOF: Independent  PATIENT GOALS: Get back to her active lifestyle without hip pain.   OBJECTIVE:       TODAY'S TREATMENT                                                                          DATE:  04/07/22. Trigger Point Dry-Needling  Treatment instructions: Expect mild to moderate muscle soreness. S/S of pneumothorax if dry needled over a lung field, and to seek immediate medical attention should they occur. Patient verbalized understanding of these instructions and education.  Patient Consent Given: Yes Education handout provided: Yes Muscles treated: Right lateral quads. Response/outcome:   Combo e'stim/US at 1.50 W/CM2 x 12 minutes f/b STW/M x 12 minutes to right quads f/b  HMP to patient's right thigh at 80-150 Hz on 40% scan x 20 minutes.  Patient tolerated treatment without complaint.    ASSESSMENT:  CLINICAL IMPRESSION: Patient tolerated DN to right lateral quads and other interventions without complaint.  We discussed desensitizing techniques using different textures (ie:  Terry cloth).   GOALS:  SHORT TERM GOALS: Target date: 04/23/2022  Ind with a HEP. Baseline: Goal status: INITIAL  LONG TERM GOALS: Target date: 05/21/2022   Perform ADL's with pain not > a 3/10. Baseline:  Goal status: INITIAL  2.  Reduce hypersensitivity by a subjective rating of 50% Baseline:  Goal status: INITIAL  3.  Return to normal exercise regimen with pain not > 2-3/10. Baseline:  Goal status: INITIAL   PLAN:  PT FREQUENCY: 2x/week  PT DURATION: 6 weeks  PLANNED INTERVENTIONS:  Therapeutic exercises, Therapeutic activity, Neuromuscular re-education, Patient/Family education, Self Care, Dry Needling, Electrical stimulation, Cryotherapy, Moist heat, scar mobilization, Vasopneumatic device, Ultrasound, Ionotophoresis '4mg'$ /ml Dexamethasone, and Manual therapy  PLAN FOR NEXT SESSION: FOTO.  Desensitization techniques, combo e'stim/US over right affected thigh (not hip), STW/M including IASTM, dry needling, recumbent bike/elliptical, knee extension machine.    Jazyah Butsch, [place], PT 04/09/2022, 12:37 PM

## 2022-04-12 ENCOUNTER — Ambulatory Visit: Payer: Medicare PPO | Admitting: Physical Therapy

## 2022-04-12 ENCOUNTER — Encounter: Payer: Self-pay | Admitting: Physical Therapy

## 2022-04-12 DIAGNOSIS — M25551 Pain in right hip: Secondary | ICD-10-CM | POA: Diagnosis not present

## 2022-04-12 DIAGNOSIS — H0100A Unspecified blepharitis right eye, upper and lower eyelids: Secondary | ICD-10-CM | POA: Diagnosis not present

## 2022-04-12 DIAGNOSIS — M79651 Pain in right thigh: Secondary | ICD-10-CM

## 2022-04-12 DIAGNOSIS — H25811 Combined forms of age-related cataract, right eye: Secondary | ICD-10-CM | POA: Diagnosis not present

## 2022-04-12 DIAGNOSIS — M545 Low back pain, unspecified: Secondary | ICD-10-CM | POA: Diagnosis not present

## 2022-04-12 DIAGNOSIS — H43813 Vitreous degeneration, bilateral: Secondary | ICD-10-CM | POA: Diagnosis not present

## 2022-04-12 DIAGNOSIS — E079 Disorder of thyroid, unspecified: Secondary | ICD-10-CM | POA: Diagnosis not present

## 2022-04-12 DIAGNOSIS — H0100B Unspecified blepharitis left eye, upper and lower eyelids: Secondary | ICD-10-CM | POA: Diagnosis not present

## 2022-04-12 NOTE — Therapy (Addendum)
OUTPATIENT PHYSICAL THERAPY LOWER EXTREMITY TREATMENT   Patient Name: Misty Gomez MRN: [id number] DOB:Apr 10, 1956, 66 y.o., female Today's Date: 04/12/2022   PT End of Session - 04/12/22 0852     Visit Number 3    Number of Visits 12    Date for PT Re-Evaluation 07/06/22    Authorization Type FOTO AT LEAST EVERY 5TH VISIT.  PROGRESS NOTE AT 10TH VISIT.  KX MODIFIER AFTER 15 VISITS.    PT Start Time [phone]    PT Stop Time 0857    PT Time Calculation (min) 46 min    Activity Tolerance Patient tolerated treatment well    Behavior During Therapy [facility] for tasks assessed/performed             Past Medical History:  Diagnosis Date   Anemia    Arthritis    Asthma    GERD (gastroesophageal reflux disease)    Hypothyroidism    Thyroid eye disease 2017   Vitamin D deficiency    Past Surgical History:  Procedure Laterality Date   BARTHOLIN GLAND CYST EXCISION     COLONOSCOPY N/A 01/06/2017   Procedure: COLONOSCOPY;  Surgeon: Rogene Houston, MD;  [facility];  Service: Endoscopy;  Laterality: N/A;  930   POLYPECTOMY  01/06/2017   Procedure: POLYPECTOMY;  Surgeon: Rogene Houston, MD;  [facility];  Service: Endoscopy;;  colon   Patient Active Problem List   Diagnosis Date Noted   Vitamin D deficiency 02/24/2018   Anemia 02/24/2018   Postablative hypothyroidism 02/24/2018   Osteopenia 02/24/2018   Special screening for malignant neoplasms, colon 09/09/2016   Other reasons for seeking consultation 12/06/2013     REFERRING PROVIDER: Jac Canavan  REFERRING DIAG: Left hip pain.  THERAPY DIAG:  Pain in right thigh  Rationale for Evaluation and Treatment Rehabilitation  ONSET DATE: 08/17/21.  SUBJECTIVE:   SUBJECTIVE STATEMENT: Did quite a bit of walking over the weekend.  Distal right knee hurt a lot.  Better now.  PERTINENT HISTORY: Right THA PAIN:  Are you having pain? Yes: NPRS scale: 3/10 Pain location: Right hip and thigh. Pain  description: As above. Aggravating factors: As above. Relieving factors: As above.  PRECAUTIONS: Anterior hip.  No ultrasound over right hip.  WEIGHT BEARING RESTRICTIONS: No  FALLS:  Has patient fallen in last 6 months? No  LIVING ENVIRONMENT: Lives with: lives with their spouse Lives in: House/apartment Has following equipment at home: None  OCCUPATION: Retired.  PLOF: Independent  PATIENT GOALS: Get back to her active lifestyle without hip pain.   OBJECTIVE:       TODAY'S TREATMENT                                                                          DATE:  04/12/22.  Recumbent bike x 8 minutes f/b Combo e'stim/US at 1.50 W/CM2 x 8 minutes to patient's right distal ITB region f/b STW/M x 8 minutes to right quads f/b  HMP to patient's right distal ITB region at 80-150 Hz on 40% scan x 15 minutes.  Patient tolerated treatment without complaint.    ASSESSMENT:  CLINICAL IMPRESSION: Patient CC is pain in the region of the right distal  ITB.  She did well with treatment today.  Instructed in side-lying hip abduction and short duration ice massage to distal ITB.   GOALS:  SHORT TERM GOALS: Target date: 04/26/2022  Ind with a HEP. Baseline: Goal status: INITIAL  LONG TERM GOALS: Target date: 05/24/2022   Perform ADL's with pain not > a 3/10. Baseline:  Goal status: INITIAL  2.  Reduce hypersensitivity by a subjective rating of 50% Baseline:  Goal status: INITIAL  3.  Return to normal exercise regimen with pain not > 2-3/10. Baseline:  Goal status: INITIAL   PLAN:  PT FREQUENCY: 2x/week  PT DURATION: 6 weeks  PLANNED INTERVENTIONS: Therapeutic exercises, Therapeutic activity, Neuromuscular re-education, Patient/Family education, Self Care, Dry Needling, Electrical stimulation, Cryotherapy, Moist heat, scar mobilization, Vasopneumatic device, Ultrasound, Ionotophoresis '4mg'$ /ml Dexamethasone, and Manual therapy  PLAN FOR NEXT SESSION: FOTO.   Desensitization techniques, combo e'stim/US over right affected thigh (not hip), STW/M including IASTM, dry needling, recumbent bike/elliptical, knee extension machine.    Lynnex Fulp, [place], PT 04/12/2022, 10:32 AM

## 2022-04-14 ENCOUNTER — Ambulatory Visit (INDEPENDENT_AMBULATORY_CARE_PROVIDER_SITE_OTHER): Payer: Medicare PPO

## 2022-04-14 ENCOUNTER — Encounter: Payer: Self-pay | Admitting: Family Medicine

## 2022-04-14 ENCOUNTER — Ambulatory Visit (INDEPENDENT_AMBULATORY_CARE_PROVIDER_SITE_OTHER): Payer: Medicare PPO | Admitting: Family Medicine

## 2022-04-14 VITALS — BP 131/73 | HR 49 | Temp 98.1°F | Ht 66.0 in | Wt 177.4 lb

## 2022-04-14 DIAGNOSIS — H259 Unspecified age-related cataract: Secondary | ICD-10-CM | POA: Diagnosis not present

## 2022-04-14 DIAGNOSIS — Z0001 Encounter for general adult medical examination with abnormal findings: Secondary | ICD-10-CM

## 2022-04-14 DIAGNOSIS — R053 Chronic cough: Secondary | ICD-10-CM | POA: Diagnosis not present

## 2022-04-14 DIAGNOSIS — E079 Disorder of thyroid, unspecified: Secondary | ICD-10-CM

## 2022-04-14 DIAGNOSIS — M8588 Other specified disorders of bone density and structure, other site: Secondary | ICD-10-CM | POA: Diagnosis not present

## 2022-04-14 DIAGNOSIS — E890 Postprocedural hypothyroidism: Secondary | ICD-10-CM | POA: Diagnosis not present

## 2022-04-14 DIAGNOSIS — Z0000 Encounter for general adult medical examination without abnormal findings: Secondary | ICD-10-CM

## 2022-04-14 DIAGNOSIS — Z8709 Personal history of other diseases of the respiratory system: Secondary | ICD-10-CM

## 2022-04-14 DIAGNOSIS — R001 Bradycardia, unspecified: Secondary | ICD-10-CM | POA: Diagnosis not present

## 2022-04-14 DIAGNOSIS — R059 Cough, unspecified: Secondary | ICD-10-CM | POA: Diagnosis not present

## 2022-04-14 DIAGNOSIS — E559 Vitamin D deficiency, unspecified: Secondary | ICD-10-CM

## 2022-04-14 DIAGNOSIS — E782 Mixed hyperlipidemia: Secondary | ICD-10-CM

## 2022-04-14 DIAGNOSIS — Z23 Encounter for immunization: Secondary | ICD-10-CM

## 2022-04-14 DIAGNOSIS — N3941 Urge incontinence: Secondary | ICD-10-CM

## 2022-04-14 DIAGNOSIS — H5789 Other specified disorders of eye and adnexa: Secondary | ICD-10-CM | POA: Diagnosis not present

## 2022-04-14 MED ORDER — MONTELUKAST SODIUM 10 MG PO TABS
10.0000 mg | ORAL_TABLET | Freq: Every day | ORAL | 3 refills | Status: AC
Start: 2022-04-14 — End: ?

## 2022-04-14 MED ORDER — LEVOTHYROXINE SODIUM 125 MCG PO TABS: 125.0000 ug | ORAL_TABLET | Freq: Every day | ORAL | 3 refills | Status: DC

## 2022-04-14 NOTE — Patient Instructions (Addendum)
Highly recommend consideration for CARDIOLOGY evaluation due to worsening bradycardia.  I reviewed your HRs from the last few years and you've been dropping steadily since 2019.  Consider evaluation with Dr Helane Rima for bladder  You got a shingles vaccine today.  Singulair added for cough. CXR will be taken and I will contact you with results once available  Preventive Care 65 Years and Older, Female Preventive care refers to lifestyle choices and visits with your health care provider that can promote health and wellness. Preventive care visits are also called wellness exams. What can I expect for my preventive care visit? Counseling Your health care provider may ask you questions about your: Medical history, including: Past medical problems. Family medical history. Pregnancy and menstrual history. History of falls. Current health, including: Memory and ability to understand (cognition). Emotional well-being. Home life and relationship well-being. Sexual activity and sexual health. Lifestyle, including: Alcohol, nicotine or tobacco, and drug use. Access to firearms. Diet, exercise, and sleep habits. Work and work Statistician. Sunscreen use. Safety issues such as seatbelt and bike helmet use. Physical exam Your health care provider will check your: Height and weight. These may be used to calculate your BMI (body mass index). BMI is a measurement that tells if you are at a healthy weight. Waist circumference. This measures the distance around your waistline. This measurement also tells if you are at a healthy weight and may help predict your risk of certain diseases, such as type 2 diabetes and high blood pressure. Heart rate and blood pressure. Body temperature. Skin for abnormal spots. What immunizations do I need?  Vaccines are usually given at various ages, according to a schedule. Your health care provider will recommend vaccines for you based on your age, medical history, and  lifestyle or other factors, such as travel or where you work. What tests do I need? Screening Your health care provider may recommend screening tests for certain conditions. This may include: Lipid and cholesterol levels. Hepatitis C test. Hepatitis B test. HIV (human immunodeficiency virus) test. STI (sexually transmitted infection) testing, if you are at risk. Lung cancer screening. Colorectal cancer screening. Diabetes screening. This is done by checking your blood sugar (glucose) after you have not eaten for a while (fasting). Mammogram. Talk with your health care provider about how often you should have regular mammograms. BRCA-related cancer screening. This may be done if you have a family history of breast, ovarian, tubal, or peritoneal cancers. Bone density scan. This is done to screen for osteoporosis. Talk with your health care provider about your test results, treatment options, and if necessary, the need for more tests. Follow these instructions at home: Eating and drinking  Eat a diet that includes fresh fruits and vegetables, whole grains, lean protein, and low-fat dairy products. Limit your intake of foods with high amounts of sugar, saturated fats, and salt. Take vitamin and mineral supplements as recommended by your health care provider. Do not drink alcohol if your health care provider tells you not to drink. If you drink alcohol: Limit how much you have to 0-1 drink a day. Know how much alcohol is in your drink. In the [place] one drink equals one 12 oz bottle of beer (355 mL), one 5 oz glass of wine (148 mL), or one 1 oz glass of hard liquor (44 mL). Lifestyle Brush your teeth every morning and night with fluoride toothpaste. Floss one time each day. Exercise for at least 30 minutes 5 or more days each week. Do not  use any products that contain nicotine or tobacco. These products include cigarettes, chewing tobacco, and vaping devices, such as e-cigarettes. If you need  help quitting, ask your health care provider. Do not use drugs. If you are sexually active, practice safe sex. Use a condom or other form of protection in order to prevent STIs. Take aspirin only as told by your health care provider. Make sure that you understand how much to take and what form to take. Work with your health care provider to find out whether it is safe and beneficial for you to take aspirin daily. Ask your health care provider if you need to take a cholesterol-lowering medicine (statin). Find healthy ways to manage stress, such as: Meditation, yoga, or listening to music. Journaling. Talking to a trusted person. Spending time with friends and family. Minimize exposure to UV radiation to reduce your risk of skin cancer. Safety Always wear your seat belt while driving or riding in a vehicle. Do not drive: If you have been drinking alcohol. Do not ride with someone who has been drinking. When you are tired or distracted. While texting. If you have been using any mind-altering substances or drugs. Wear a helmet and other protective equipment during sports activities. If you have firearms in your house, make sure you follow all gun safety procedures. What's next? Visit your health care provider once a year for an annual wellness visit. Ask your health care provider how often you should have your eyes and teeth checked. Stay up to date on all vaccines. This information is not intended to replace advice given to you by your health care provider. Make sure you discuss any questions you have with your health care provider. Document Revised: 11/26/2020 Document Reviewed: 11/26/2020 Elsevier Patient Education  [employer].

## 2022-04-14 NOTE — Progress Notes (Signed)
Misty Gomez is a 66 y.o. female presents to office today for annual physical exam examination.    Concerns today include: 1. Cough Patient with ongoing cough.  She had bronchitis a couple of months ago and has had ongoing residual cough that seems to be present all the time.  She has been using the albuterol fairly regularly, up to 4 times per day.  She denies any history of smoking.  No smoke exposure and no particular exposure but does have a history of asthma as a youth.  She denies any hemoptysis, shortness of breath.  She does feel like she has upper airway wheezing, particularly when she lays down.  Tessalon Perles have been somewhat helpful for controlling cough.  She resumed use of Zyrtec in efforts to help upper respiratory symptoms but this did not really help at all.  2.  Right hip pain She is currently under the care of physical therapy for IT band induced hip pain.  She does feel like this condition is improving with this therapy.  Has history of right hip surgery.  Continues to have chronic numbness in the right thigh  3.  Hypothyroidism Patient is compliant with her Synthroid.  Needs refills.  Has known thyroid eye disease and is under the care of ophthalmology.  She apparently was found to have advancing cataracts after being treated with him positive for thyroid eye disease as well as worsening hearing.  She is expected to undergo bilateral cataract extraction in December  4.  Urge incontinence Patient has known urge incontinence.  Previously treated by her OB/GYN, Dr. Helane Rima, with a series of injection therapies which did keep the urge incontinence gone for about 3 years.  She has no desire to be on any pills for urge incontinence.  She tries to keep her bladder on a schedule such that she is frequently evacuating it.  Occupation: retired, Marital status: married, Substance use: none Diet: typical [place], Exercise: no structured due to hip pain Last eye exam: UTD Last  dental exam: UTD, had a tooth extracted Last colonoscopy: UTD Last mammogram: UTD Last pap smear: n/a Refills needed today: all Immunizations needed: Flu at [facility], needs shingles. Immunization History  Administered Date(s) Administered   Fluad Quad(high Dose 65+) 03/30/2021   Influenza-Unspecified 05/31/2018   MMR 02/23/2008, 03/22/2008   Moderna Sars-Covid-2 Vaccination 06/19/2019, 07/17/2019, 04/05/2020, 01/05/2021   Tdap 03/18/2020     Past Medical History:  Diagnosis Date   Anemia    Arthritis    Asthma    GERD (gastroesophageal reflux disease)    Hypothyroidism    Thyroid eye disease 2017   Vitamin D deficiency    Social History   Socioeconomic History   Marital status: Married    Spouse name: Not on file   Number of children: 0   Years of education: Not on file   Highest education level: Not on file  Occupational History   Not on file  Tobacco Use   Smoking status: Never   Smokeless tobacco: Never  Vaping Use   Vaping Use: Never used  Substance and Sexual Activity   Alcohol use: No   Drug use: No   Sexual activity: Not Currently  Other Topics Concern   Not on file  Social History Narrative   Misty Gomez is a 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   She walks several times weekly.   Social Determinants of Health  Financial Resource Strain: Not on file  Food Insecurity: Not on file  Transportation Needs: Not on file  Physical Activity: Not on file  Stress: Not on file  Social Connections: Not on file  Intimate Partner Violence: Not on file   Past Surgical History:  Procedure Laterality Date   BARTHOLIN GLAND CYST EXCISION     COLONOSCOPY N/A 01/06/2017   Procedure: COLONOSCOPY;  Surgeon: Rogene Houston, MD;  [facility];  Service: Endoscopy;  Laterality: N/A;  930   POLYPECTOMY  01/06/2017   Procedure: POLYPECTOMY;  Surgeon: Rogene Houston, MD;  [facility];   Service: Endoscopy;;  colon   Family History  Problem Relation Age of Onset   CVA Mother 63   Rheum arthritis Mother    Heart attack Mother 58   Heart disease Mother    Heart attack Father 63       had first heart attack in early 41s   Heart disease Father    Cancer Brother    Heart disease Brother    Cancer Brother    Diabetes Brother    Hypertension Brother    Pancreatic disease Maternal Grandmother    Diabetes Maternal Grandmother    Asthma Maternal Grandfather    Heart attack Maternal Grandfather    Heart attack Paternal Grandmother    Heart attack Paternal Grandfather     Current Outpatient Medications:    acetaminophen (TYLENOL) 500 MG tablet, , Disp: , Rfl:    albuterol (VENTOLIN HFA) 108 (90 Base) MCG/ACT inhaler, Inhale 1-2 puffs into the lungs every 4 (four) hours as needed for wheezing or shortness of breath. (NEEDS TO BE SEEN BEFORE NEXT REFILL), Disp: 6.7 each, Rfl: 0   albuterol (VENTOLIN HFA) 108 (90 Base) MCG/ACT inhaler, Inhale 1-2 puffs into the lungs every 4 (four) hours as needed for wheezing or shortness of breath., Disp: 1 each, Rfl: 0   benzonatate (TESSALON) 200 MG capsule, Take 1 capsule (200 mg total) by mouth 3 (three) times daily as needed., Disp: 30 capsule, Rfl: 1   levothyroxine (SYNTHROID) 125 MCG tablet, TAKE 1 TABLET BY MOUTH EVERY DAY, Disp: 90 tablet, Rfl: 0   methocarbamol (ROBAXIN) 500 MG tablet, Take 0.5-1 tablets (250-500 mg total) by mouth every 6 (six) hours as needed for muscle spasms., Disp: 40 tablet, Rfl: 0   omeprazole (PRILOSEC) 20 MG capsule, Take 20 mg by mouth daily., Disp: , Rfl:    Pregabalin (LYRICA PO), Take by mouth., Disp: , Rfl:    cetirizine (ZYRTEC) 10 MG tablet, Take 1 tablet (10 mg total) by mouth daily. (Patient not taking: Reported on 04/07/2022), Disp: 30 tablet, Rfl: 11  Allergies  Allergen Reactions   Relafen [Nabumetone] Rash    All over     ROS: Review of Systems A comprehensive review of systems was  negative except for: Eyes: positive for cataracts Respiratory: positive for cough Musculoskeletal: positive for right hip/ thigh pain    Physical exam BP (!) 142/77   Pulse (!) 49   Temp 98.1 F (36.7 C)   Ht _0  (1.676 m)   Wt 177 lb 6.4 oz (80.5 kg)   SpO2 98%   BMI 28.63 kg/m  General appearance: alert, cooperative, appears stated age, and no distress Head: Normocephalic, without obvious abnormality, atraumatic Eyes: negative findings: lids and lashes normal, conjunctivae and sclerae normal, corneas clear, and pupils equal, round, reactive to light and accomodation Ears: normal TM's and external ear canals both ears  Nose: Nares normal. Septum midline. Mucosa normal. No drainage or sinus tenderness. Throat: lips, mucosa, and tongue normal; gums normal. Missing tooth on left lower Neck: no adenopathy, supple, symmetrical, trachea midline, and thyroid not enlarged, symmetric, no tenderness/mass/nodules Back: symmetric, no curvature. ROM normal. No CVA tenderness. Lungs: clear to auscultation bilaterally Heart: bradycardic with regular rhythm, S1, S2 normal, no murmur, click, rub or gallop Abdomen: soft, non-tender; bowel sounds normal; no masses,  no organomegaly Extremities:  postsurgical changes to right anteriolateral hip Pulses: 2+ and symmetric Skin: Skin color, texture, turgor normal. No rashes or lesions Lymph nodes: Cervical, supraclavicular, and axillary nodes normal. Neurologic: Grossly normal Psych: mood stable speech normal.  Flowsheet Row Office Visit from 04/14/2022 in [place]  PHQ-2 Total Score 0        Assessment/ Plan: [name] here for annual physical exam.   Annual physical exam  Age-related cataract of both eyes, unspecified age-related cataract type  Thyroid eye disease  Postablative hypothyroidism - Plan: TSH, T4, Free  Mixed hyperlipidemia - Plan: CMP14+EGFR, Lipid Panel  Osteopenia of lumbar spine  Vitamin  D deficiency - Plan: VITAMIN D 25 Hydroxy (Vit-D Deficiency, Fractures)  Persistent cough - Plan: DG Chest 2 View, montelukast (SINGULAIR) 10 MG tablet, CBC  History of asthma - Plan: montelukast (SINGULAIR) 10 MG tablet  Bradycardia  Urge incontinence   Has cataract surgery scheduled for December.  Currently being treated with him has a for thyroid eye disease.  This unfortunately has negatively affected her hearing  Check thyroid levels, lipid panel, CMP, vitamin D level and CBC  Uncertain etiology of chronic cough.  We discussed differentials including bronchospasm related to recent pulmonary illness versus seasonal asthma versus GERD induced.  Given bradycardia I do question if perhaps this is manifestation of a cardiopulmonary issue as well.  I think that she should consider cardiac eval at some point.  I reviewed her last several years worth of heart rates and it looks like she has had a general decline from the 60s to current level in the 40s.  Going to place her on Singulair for the next several weeks.  We will reconvene via virtual visit in 1 month.  If no significant improvement, low threshold to start inhaled corticosteroid versus reinitiate PPI.  I feel this is less likely to be GERD induced given absence of GERD symptoms.  May need to consider formal referral to pulmonology pending response.  In the interim, chest x-ray will be obtained to further evaluate.  No known risk factors including never smoker, no exposure to particulates or secondhand smoke.  Consider following up with Dr. Helane Rima for urge incontinence   Karine Garn M. Lajuana Ripple, DO

## 2022-04-14 NOTE — Addendum Note (Signed)
Addended by: Everlean Cherry on: 04/14/2022 11:28 AM   Modules accepted: Orders

## 2022-04-15 ENCOUNTER — Encounter: Payer: Self-pay | Admitting: Physical Therapy

## 2022-04-15 ENCOUNTER — Ambulatory Visit: Payer: Medicare PPO | Attending: Nurse Practitioner | Admitting: Physical Therapy

## 2022-04-15 DIAGNOSIS — M79651 Pain in right thigh: Secondary | ICD-10-CM | POA: Insufficient documentation

## 2022-04-15 DIAGNOSIS — M545 Low back pain, unspecified: Secondary | ICD-10-CM | POA: Insufficient documentation

## 2022-04-15 DIAGNOSIS — E0500 Thyrotoxicosis with diffuse goiter without thyrotoxic crisis or storm: Secondary | ICD-10-CM | POA: Diagnosis not present

## 2022-04-15 LAB — LIPID PANEL
Chol/HDL Ratio: 3.4 ratio (ref 0.0–4.4)
Cholesterol, Total: 209 mg/dL — ABNORMAL HIGH (ref 100–199)
HDL: 61 mg/dL (ref >39)
LDL Chol Calc (NIH): 136 mg/dL — ABNORMAL HIGH (ref 0–99)
Triglycerides: 68 mg/dL (ref 0–149)
VLDL Cholesterol Cal: 12 mg/dL (ref 5–40)

## 2022-04-15 LAB — CMP14+EGFR
ALT: 12 IU/L (ref 0–32)
AST: 22 IU/L (ref 0–40)
Albumin/Globulin Ratio: 1.4 (ref 1.2–2.2)
Albumin: 4.3 g/dL (ref 3.9–4.9)
Alkaline Phosphatase: 71 IU/L (ref 44–121)
BUN/Creatinine Ratio: 20 (ref 12–28)
BUN: 15 mg/dL (ref 8–27)
Bilirubin Total: 0.4 mg/dL (ref 0.0–1.2)
CO2: 23 mmol/L (ref 20–29)
Calcium: 9.3 mg/dL (ref 8.7–10.3)
Chloride: 105 mmol/L (ref 96–106)
Creatinine, Ser: 0.74 mg/dL (ref 0.57–1.00)
Globulin, Total: 3 g/dL (ref 1.5–4.5)
Glucose: 86 mg/dL (ref 70–99)
Potassium: 4.5 mmol/L (ref 3.5–5.2)
Sodium: 140 mmol/L (ref 134–144)
Total Protein: 7.3 g/dL (ref 6.0–8.5)
eGFR: 89 mL/min/{1.73_m2} (ref >59)

## 2022-04-15 LAB — CBC
Hematocrit: 36.3 % (ref 34.0–46.6)
Hemoglobin: 11.4 g/dL (ref 11.1–15.9)
MCH: 23.5 pg — ABNORMAL LOW (ref 26.6–33.0)
MCHC: 31.4 g/dL — ABNORMAL LOW (ref 31.5–35.7)
MCV: 75 fL — ABNORMAL LOW (ref 79–97)
Platelets: 202 10*3/uL (ref 150–450)
RBC: 4.86 x10E6/uL (ref 3.77–5.28)
RDW: 14.3 % (ref 11.7–15.4)
WBC: 4.9 10*3/uL (ref 3.4–10.8)

## 2022-04-15 LAB — TSH: TSH: 0.14 u[IU]/mL — ABNORMAL LOW (ref 0.450–4.500)

## 2022-04-15 LAB — T4, FREE: Free T4: 1.56 ng/dL (ref 0.82–1.77)

## 2022-04-15 LAB — VITAMIN D 25 HYDROXY (VIT D DEFICIENCY, FRACTURES): Vit D, 25-Hydroxy: 31.7 ng/mL (ref 30.0–100.0)

## 2022-04-15 NOTE — Therapy (Addendum)
OUTPATIENT PHYSICAL THERAPY LOWER EXTREMITY TREATMENT   Patient Name: Misty Gomez MRN: [id number] DOB:02/03/1956, 66 y.o., female Today's Date: 04/15/2022   PT End of Session - 04/15/22 1554     Visit Number 4    Number of Visits 12    Date for PT Re-Evaluation 07/06/22    Authorization Type FOTO AT LEAST EVERY 5TH VISIT.  PROGRESS NOTE AT 10TH VISIT.  KX MODIFIER AFTER 15 VISITS.    PT Start Time 0146    PT Stop Time 0233    PT Time Calculation (min) 47 min    Activity Tolerance Patient tolerated treatment well    Behavior During Therapy [facility] for tasks assessed/performed             Past Medical History:  Diagnosis Date   Anemia    Arthritis    Asthma    GERD (gastroesophageal reflux disease)    Hypothyroidism    Thyroid eye disease 2017   Vitamin D deficiency    Past Surgical History:  Procedure Laterality Date   BARTHOLIN GLAND CYST EXCISION     COLONOSCOPY N/A 01/06/2017   Procedure: COLONOSCOPY;  Surgeon: Rogene Houston, MD;  [facility];  Service: Endoscopy;  Laterality: N/A;  930   POLYPECTOMY  01/06/2017   Procedure: POLYPECTOMY;  Surgeon: Rogene Houston, MD;  [facility];  Service: Endoscopy;;  colon   Patient Active Problem List   Diagnosis Date Noted   History of asthma 04/14/2022   Mixed hyperlipidemia 04/14/2022   Age-related cataract of both eyes 04/14/2022   Bradycardia 04/14/2022   Thyroid eye disease 04/14/2022   Urge incontinence 04/14/2022   Vitamin D deficiency 02/24/2018   Anemia 02/24/2018   Postablative hypothyroidism 02/24/2018   Osteopenia of lumbar spine 02/24/2018   Special screening for malignant neoplasms, colon 09/09/2016   Other reasons for seeking consultation 12/06/2013     REFERRING PROVIDER: Jac Canavan  REFERRING DIAG: Left hip pain.  THERAPY DIAG:  Pain in right thigh  Rationale for Evaluation and Treatment Rehabilitation  ONSET DATE: 08/17/21.  SUBJECTIVE:   SUBJECTIVE  STATEMENT: Doing better.  PERTINENT HISTORY: Right THA PAIN:  Are you having pain? Yes: NPRS scale: 3/10 Pain location: Right hip and thigh. Pain description: As above. Aggravating factors: As above. Relieving factors: As above.  PRECAUTIONS: Anterior hip.  No ultrasound over right hip.  WEIGHT BEARING RESTRICTIONS: No  FALLS:  Has patient fallen in last 6 months? No  LIVING ENVIRONMENT: Lives with: lives with their spouse Lives in: House/apartment Has following equipment at home: None  OCCUPATION: Retired.  PLOF: Independent  PATIENT GOALS: Get back to her active lifestyle without hip pain.   OBJECTIVE:       TODAY'S TREATMENT                                                                          DATE:  04/15/22.  Combo e'stim/US at 1.50 W/CM2 x 10 minutes to patient's right distal ITB region f/b STW/M x 8 minutes to distal/lateral right quads f/b  HMP to patient's right distal ITB region at 80-150 Hz on 40% scan x 15 minutes f/b Ionto with Dex 80 mA-Min.  Patient tolerated  treatment without complaint.    ASSESSMENT:  CLINICAL IMPRESSION: Patient reporting significant improvement.  Focused on distal ITB today.  Good response to treatment.  Added Iontophoresis patch which patient will remove in 4 hours.    GOALS:  SHORT TERM GOALS: Target date: 04/29/2022  Ind with a HEP. Baseline: Goal status: INITIAL  LONG TERM GOALS: Target date: 05/27/2022   Perform ADL's with pain not > a 3/10. Baseline:  Goal status: INITIAL  2.  Reduce hypersensitivity by a subjective rating of 50% Baseline:  Goal status: INITIAL  3.  Return to normal exercise regimen with pain not > 2-3/10. Baseline:  Goal status: INITIAL   PLAN:  PT FREQUENCY: 2x/week  PT DURATION: 6 weeks  PLANNED INTERVENTIONS: Therapeutic exercises, Therapeutic activity, Neuromuscular re-education, Patient/Family education, Self Care, Dry Needling, Electrical stimulation, Cryotherapy, Moist heat,  scar mobilization, Vasopneumatic device, Ultrasound, Ionotophoresis '4mg'$ /ml Dexamethasone, and Manual therapy  PLAN FOR NEXT SESSION: FOTO.  Desensitization techniques, combo e'stim/US over right affected thigh (not hip), STW/M including IASTM, dry needling, recumbent bike/elliptical, knee extension machine.    Glennis Montenegro, [place], PT 04/15/2022, 3:56 PM

## 2022-04-16 ENCOUNTER — Other Ambulatory Visit: Payer: Self-pay

## 2022-04-16 DIAGNOSIS — E890 Postprocedural hypothyroidism: Secondary | ICD-10-CM

## 2022-04-19 ENCOUNTER — Encounter: Payer: Self-pay | Admitting: Physical Therapy

## 2022-04-19 ENCOUNTER — Ambulatory Visit: Payer: Medicare PPO | Admitting: Physical Therapy

## 2022-04-19 DIAGNOSIS — M79651 Pain in right thigh: Secondary | ICD-10-CM

## 2022-04-19 DIAGNOSIS — M545 Low back pain, unspecified: Secondary | ICD-10-CM | POA: Diagnosis not present

## 2022-04-19 NOTE — Therapy (Addendum)
OUTPATIENT PHYSICAL THERAPY LOWER EXTREMITY TREATMENT   Patient Name: NAZIYA HEGWOOD MRN: [id number] DOB:04-25-1956, 66 y.o., female Today's Date: 04/19/2022   PT End of Session - 04/19/22 1331     Visit Number 5    Number of Visits 12    Date for PT Re-Evaluation 07/06/22    Authorization Type FOTO AT LEAST EVERY 5TH VISIT.  PROGRESS NOTE AT 10TH VISIT.  KX MODIFIER AFTER 15 VISITS.    PT Start Time 1250             Past Medical History:  Diagnosis Date   Anemia    Arthritis    Asthma    GERD (gastroesophageal reflux disease)    Hypothyroidism    Thyroid eye disease 2017   Vitamin D deficiency    Past Surgical History:  Procedure Laterality Date   BARTHOLIN GLAND CYST EXCISION     COLONOSCOPY N/A 01/06/2017   Procedure: COLONOSCOPY;  Surgeon: Rogene Houston, MD;  [facility];  Service: Endoscopy;  Laterality: N/A;  930   POLYPECTOMY  01/06/2017   Procedure: POLYPECTOMY;  Surgeon: Rogene Houston, MD;  [facility];  Service: Endoscopy;;  colon   Patient Active Problem List   Diagnosis Date Noted   History of asthma 04/14/2022   Mixed hyperlipidemia 04/14/2022   Age-related cataract of both eyes 04/14/2022   Bradycardia 04/14/2022   Thyroid eye disease 04/14/2022   Urge incontinence 04/14/2022   Vitamin D deficiency 02/24/2018   Anemia 02/24/2018   Postablative hypothyroidism 02/24/2018   Osteopenia of lumbar spine 02/24/2018   Special screening for malignant neoplasms, colon 09/09/2016   Other reasons for seeking consultation 12/06/2013     REFERRING PROVIDER: Jac Canavan  REFERRING DIAG: Left hip pain.  THERAPY DIAG:  Pain in right thigh  Rationale for Evaluation and Treatment Rehabilitation  ONSET DATE: 08/17/21.  SUBJECTIVE:   SUBJECTIVE STATEMENT: Doing better.  PERTINENT HISTORY: Right THA PAIN:  Are you having pain? Yes: NPRS scale: 3/10 Pain location: Right hip and thigh. Pain description: As  above. Aggravating factors: As above. Relieving factors: As above.  PRECAUTIONS: Anterior hip.  No ultrasound over right hip.  WEIGHT BEARING RESTRICTIONS: No  FALLS:  Has patient fallen in last 6 months? No  LIVING ENVIRONMENT: Lives with: lives with their spouse Lives in: House/apartment Has following equipment at home: None  OCCUPATION: Retired.  PLOF: Independent  PATIENT GOALS: Get back to her active lifestyle without hip pain.   OBJECTIVE:       TODAY'S TREATMENT                                                                          DATE:  04/19/22.  Combo e'stim/US at 1.50 W/CM2 x 10 minutes to patient's right distal ITB region f/b STW/M x 7 minutes to distal/lateral right quads f/b  HMP to patient's right distal ITB region at 80-150 Hz on 40% scan x 20 minutes f/b Ionto with Dex 80 mA-Min.  Patient tolerated treatment without complaint.    ASSESSMENT:  CLINICAL IMPRESSION: Patient reporting significant improvement.  Focused on distal ITB today.  Good response to treatment.  Added Iontophoresis patch which patient will remove in 4  hours.    GOALS:  SHORT TERM GOALS: Target date: 05/03/2022  Ind with a HEP. Baseline: Goal status: INITIAL  LONG TERM GOALS: Target date: 05/31/2022   Perform ADL's with pain not > a 3/10. Baseline:  Goal status: INITIAL  2.  Reduce hypersensitivity by a subjective rating of 50% Baseline:  Goal status: INITIAL  3.  Return to normal exercise regimen with pain not > 2-3/10. Baseline:  Goal status: INITIAL   PLAN:  PT FREQUENCY: 2x/week  PT DURATION: 6 weeks  PLANNED INTERVENTIONS: Therapeutic exercises, Therapeutic activity, Neuromuscular re-education, Patient/Family education, Self Care, Dry Needling, Electrical stimulation, Cryotherapy, Moist heat, scar mobilization, Vasopneumatic device, Ultrasound, Ionotophoresis '4mg'$ /ml Dexamethasone, and Manual therapy  PLAN FOR NEXT SESSION: FOTO.  Desensitization techniques,  combo e'stim/US over right affected thigh (not hip), STW/M including IASTM, dry needling, recumbent bike/elliptical, knee extension machine.    Tracee Mccreery, [place], PT 04/19/2022, 1:32 PM

## 2022-04-22 ENCOUNTER — Encounter: Payer: Self-pay | Admitting: Physical Therapy

## 2022-04-22 ENCOUNTER — Ambulatory Visit: Payer: Medicare PPO | Admitting: Physical Therapy

## 2022-04-22 DIAGNOSIS — M545 Low back pain, unspecified: Secondary | ICD-10-CM | POA: Diagnosis not present

## 2022-04-22 DIAGNOSIS — M79651 Pain in right thigh: Secondary | ICD-10-CM | POA: Diagnosis not present

## 2022-04-22 NOTE — Therapy (Addendum)
OUTPATIENT PHYSICAL THERAPY LOWER EXTREMITY TREATMENT  Patient Name: Misty Gomez MRN: [id number] DOB:[birth year], 66 y.o., female Today's Date: 04/22/22   PT End of Session - 04/19/22 1331     Visit Number 5    Number of Visits 12    Date for PT Re-Evaluation 07/06/22    Authorization Type FOTO AT LEAST EVERY 5TH VISIT.  PROGRESS NOTE AT 10TH VISIT.  KX MODIFIER AFTER 15 VISITS.    PT Start Time 1250             Past Medical History:  Diagnosis Date   Anemia    Arthritis    Asthma    GERD (gastroesophageal reflux disease)    Hypothyroidism    Thyroid eye disease 2017   Vitamin D deficiency    Past Surgical History:  Procedure Laterality Date   BARTHOLIN GLAND CYST EXCISION     COLONOSCOPY N/A 01/06/2017   Procedure: COLONOSCOPY;  Surgeon: Rogene Houston, MD;  Location: [facility];  Service: Endoscopy;  Laterality: N/A;  930   POLYPECTOMY  01/06/2017   Procedure: POLYPECTOMY;  Surgeon: Rogene Houston, MD;  Location: [facility];  Service: Endoscopy;;  colon   Patient Active Problem List   Diagnosis Date Noted   History of asthma 04/14/2022   Mixed hyperlipidemia 04/14/2022   Age-related cataract of both eyes 04/14/2022   Bradycardia 04/14/2022   Thyroid eye disease 04/14/2022   Urge incontinence 04/14/2022   Vitamin D deficiency 02/24/2018   Anemia 02/24/2018   Postablative hypothyroidism 02/24/2018   Osteopenia of lumbar spine 02/24/2018   Special screening for malignant neoplasms, colon 09/09/2016   Other reasons for seeking consultation 12/06/2013     REFERRING PROVIDER: Jac Canavan  REFERRING DIAG: Left hip pain.  THERAPY DIAG:  Pain in right thigh  Rationale for Evaluation and Treatment Rehabilitation  ONSET DATE: 08/17/21.  SUBJECTIVE:   SUBJECTIVE STATEMENT: Did several hours of driving.  Increased pain.  PERTINENT HISTORY: Right THA PAIN:  Are you having pain? Yes: NPRS scale: 5/10 Pain location: Right hip and thigh. Pain  description: As above. Aggravating factors: As above. Relieving factors: As above.  PRECAUTIONS: Anterior hip.  No ultrasound over right hip.  WEIGHT BEARING RESTRICTIONS: No  FALLS:  Has patient fallen in last 6 months? No  LIVING ENVIRONMENT: Lives with: lives with their spouse Lives in: House/apartment Has following equipment at home: None  OCCUPATION: Retired.  PLOF: Independent  PATIENT GOALS: Get back to her active lifestyle without hip pain.   OBJECTIVE:       TODAY'S TREATMENT                                                                          DATE:  04/22/22.  Combo e'stim/US at 1.50 W/CM2 x 12 minutes to patient's right  ITB region f/b STW/M x 10 minutes to distal/lateral right quads f/b  HMP to patient's right distal ITB region at 80-150 Hz on 40% scan x 20 minutes f/b Ionto with Dex 80 mA-Min.  Patient tolerated treatment without complaint.    ASSESSMENT:  CLINICAL IMPRESSION: Increased pain along right ITB upon presentation to the clinic due to several of hours of driving.  Good response  to treatment today.     GOALS:  SHORT TERM GOALS: Target date: 05/03/2022  Ind with a HEP. Baseline: Goal status: INITIAL  LONG TERM GOALS: Target date: 05/31/2022   Perform ADL's with pain not > a 3/10. Baseline:  Goal status: INITIAL  2.  Reduce hypersensitivity by a subjective rating of 50% Baseline:  Goal status: INITIAL  3.  Return to normal exercise regimen with pain not > 2-3/10. Baseline:  Goal status: INITIAL   PLAN:  PT FREQUENCY: 2x/week  PT DURATION: 6 weeks  PLANNED INTERVENTIONS: Therapeutic exercises, Therapeutic activity, Neuromuscular re-education, Patient/Family education, Self Care, Dry Needling, Electrical stimulation, Cryotherapy, Moist heat, scar mobilization, Vasopneumatic device, Ultrasound, Ionotophoresis '4mg'$ /ml Dexamethasone, and Manual therapy  PLAN FOR NEXT SESSION: FOTO.  Desensitization techniques, combo e'stim/US over  right affected thigh (not hip), STW/M including IASTM, dry needling, recumbent bike/elliptical, knee extension machine.    Brailey Buescher, [place], PT 04/22/22, 1:32 PM

## 2022-04-26 ENCOUNTER — Encounter: Payer: Self-pay | Admitting: Physical Therapy

## 2022-04-26 ENCOUNTER — Ambulatory Visit: Payer: Medicare PPO | Admitting: Physical Therapy

## 2022-04-26 DIAGNOSIS — M545 Low back pain, unspecified: Secondary | ICD-10-CM | POA: Diagnosis not present

## 2022-04-26 DIAGNOSIS — M79651 Pain in right thigh: Secondary | ICD-10-CM | POA: Diagnosis not present

## 2022-04-26 NOTE — Therapy (Signed)
OUTPATIENT PHYSICAL THERAPY LOWER EXTREMITY TREATMENT   Patient Name: Misty Gomez MRN: [id number] DOB:Jan 25, 1956, 66 y.o., female Today's Date: 04/26/2022   PT End of Session - 04/26/22 1510     Visit Number 7    Number of Visits 12    Date for PT Re-Evaluation 07/06/22    Authorization Type FOTO AT LEAST EVERY 5TH VISIT.  PROGRESS NOTE AT 10TH VISIT.  KX MODIFIER AFTER 15 VISITS.    PT Start Time 0223    PT Stop Time 0305    PT Time Calculation (min) 42 min    Activity Tolerance Patient tolerated treatment well             Past Medical History:  Diagnosis Date   Anemia    Arthritis    Asthma    GERD (gastroesophageal reflux disease)    Hypothyroidism    Thyroid eye disease 2017   Vitamin D deficiency    Past Surgical History:  Procedure Laterality Date   BARTHOLIN GLAND CYST EXCISION     COLONOSCOPY N/A 01/06/2017   Procedure: COLONOSCOPY;  Surgeon: Rogene Houston, MD;  Location: [facility];  Service: Endoscopy;  Laterality: N/A;  930   POLYPECTOMY  01/06/2017   Procedure: POLYPECTOMY;  Surgeon: Rogene Houston, MD;  [facility]: [facility];  Service: Endoscopy;;  colon   Patient Active Problem List   Diagnosis Date Noted   History of asthma 04/14/2022   Mixed hyperlipidemia 04/14/2022   Age-related cataract of both eyes 04/14/2022   Bradycardia 04/14/2022   Thyroid eye disease 04/14/2022   Urge incontinence 04/14/2022   Vitamin D deficiency 02/24/2018   Anemia 02/24/2018   Postablative hypothyroidism 02/24/2018   Osteopenia of lumbar spine 02/24/2018   Special screening for malignant neoplasms, colon 09/09/2016   Other reasons for seeking consultation 12/06/2013     REFERRING PROVIDER: Jac Canavan  REFERRING DIAG: Left hip pain.  THERAPY DIAG:  Pain in right thigh  Rationale for Evaluation and Treatment Rehabilitation  ONSET DATE: 08/17/21.  SUBJECTIVE:   SUBJECTIVE STATEMENT: A lot of pain last Friday due to a lot of activity but  down to a 1 today.  PERTINENT HISTORY: Right THA PAIN:  Are you having pain? Yes: NPRS scale: 1/10 Pain location: Right hip and thigh. Pain description: As above. Aggravating factors: As above. Relieving factors: As above.  PRECAUTIONS: Anterior hip.  No ultrasound over right hip.  WEIGHT BEARING RESTRICTIONS: No  FALLS:  Has patient fallen in last 6 months? No  LIVING ENVIRONMENT: Lives with: lives with their spouse Lives in: House/apartment Has following equipment at home: None  OCCUPATION: Retired.  PLOF: Independent  PATIENT GOALS: Get back to her active lifestyle without hip pain.   OBJECTIVE:       TODAY'S TREATMENT                                                                          DATE:  04/26/22.  Combo e'stim/US at 1.50 W/CM2 x 10 minutes to patient's right  ITB region f/b STW/M including IASTM  x 10 minutes to distal/lateral right quads f/b  HMP to patient's right distal ITB region at 80-150 Hz on 40% scan x 10 minutes  f/b Ionto with Dex 80 mA-Min.  Patient tolerated treatment without complaint.    ASSESSMENT:  CLINICAL IMPRESSION: Patient with flare-up on 04/05/22 due to a lot of activity but much better today.  Patient pleased with progress thus far and tolerated treatment without complaint.  GOALS:  SHORT TERM GOALS: Target date: 05/10/2022  Ind with a HEP. Baseline: Goal status: INITIAL  LONG TERM GOALS: Target date: 06/07/2022   Perform ADL's with pain not > a 3/10. Baseline:  Goal status: INITIAL  2.  Reduce hypersensitivity by a subjective rating of 50% Baseline:  Goal status: INITIAL  3.  Return to normal exercise regimen with pain not > 2-3/10. Baseline:  Goal status: INITIAL   PLAN:  PT FREQUENCY: 2x/week  PT DURATION: 6 weeks  PLANNED INTERVENTIONS: Therapeutic exercises, Therapeutic activity, Neuromuscular re-education, Patient/Family education, Self Care, Dry Needling, Electrical stimulation, Cryotherapy, Moist  heat, scar mobilization, Vasopneumatic device, Ultrasound, Ionotophoresis '4mg'$ /ml Dexamethasone, and Manual therapy  PLAN FOR NEXT SESSION: FOTO.  Desensitization techniques, combo e'stim/US over right affected thigh (not hip), STW/M including IASTM, dry needling, recumbent bike/elliptical, knee extension machine.    Tavien Chestnut, [place], PT 04/26/2022, 3:12 PM

## 2022-04-29 ENCOUNTER — Encounter: Payer: Self-pay | Admitting: Physical Therapy

## 2022-04-29 ENCOUNTER — Ambulatory Visit: Payer: Medicare PPO | Admitting: Physical Therapy

## 2022-04-29 DIAGNOSIS — M79651 Pain in right thigh: Secondary | ICD-10-CM | POA: Diagnosis not present

## 2022-04-29 DIAGNOSIS — M545 Low back pain, unspecified: Secondary | ICD-10-CM | POA: Diagnosis not present

## 2022-04-29 NOTE — Therapy (Addendum)
OUTPATIENT PHYSICAL THERAPY LOWER EXTREMITY TREATMENT   Patient Name: Misty Gomez MRN: [id number] DOB:12-04-1955, 66 y.o., female Today's Date: 04/29/2022   PT End of Session - 04/29/22 1649     Visit Number 8    Number of Visits 12    Date for PT Re-Evaluation 07/06/22    Authorization Type FOTO AT LEAST EVERY 5TH VISIT.  PROGRESS NOTE AT 10TH VISIT.  KX MODIFIER AFTER 15 VISITS.    PT Start Time 0315    PT Stop Time 0408    PT Time Calculation (min) 53 min    Activity Tolerance Patient tolerated treatment well    Behavior During Therapy WFL for tasks assessed/performed             Past Medical History:  Diagnosis Date   Anemia    Arthritis    Asthma    GERD (gastroesophageal reflux disease)    Hypothyroidism    Thyroid eye disease 2017   Vitamin D deficiency    Past Surgical History:  Procedure Laterality Date   BARTHOLIN GLAND CYST EXCISION     COLONOSCOPY N/A 01/06/2017   Procedure: COLONOSCOPY;  Surgeon: Rogene Houston, MD;  Location: [facility];  Service: Endoscopy;  Laterality: N/A;  930   POLYPECTOMY  01/06/2017   Procedure: POLYPECTOMY;  Surgeon: Rogene Houston, MD;  Location: [facility];  Service: Endoscopy;;  colon   Patient Active Problem List   Diagnosis Date Noted   History of asthma 04/14/2022   Mixed hyperlipidemia 04/14/2022   Age-related cataract of both eyes 04/14/2022   Bradycardia 04/14/2022   Thyroid eye disease 04/14/2022   Urge incontinence 04/14/2022   Vitamin D deficiency 02/24/2018   Anemia 02/24/2018   Postablative hypothyroidism 02/24/2018   Osteopenia of lumbar spine 02/24/2018   Special screening for malignant neoplasms, colon 09/09/2016   Other reasons for seeking consultation 12/06/2013     REFERRING PROVIDER: Jac Canavan  REFERRING DIAG: Left hip pain.  THERAPY DIAG:  Pain in right thigh  Rationale for Evaluation and Treatment Rehabilitation  ONSET DATE: 08/17/21.  SUBJECTIVE:   SUBJECTIVE  STATEMENT: Much better. PERTINENT HISTORY: Right THA PAIN:  Are you having pain? Yes: NPRS scale: 1/10 Pain location: Right hip and thigh. Pain description: As above. Aggravating factors: As above. Relieving factors: As above.  PRECAUTIONS: Anterior hip.  No ultrasound over right hip.  WEIGHT BEARING RESTRICTIONS: No  FALLS:  Has patient fallen in last 6 months? No  LIVING ENVIRONMENT: Lives with: lives with their spouse Lives in: House/apartment Has following equipment at home: None  OCCUPATION: Retired.  PLOF: Independent  PATIENT GOALS: Get back to her active lifestyle without hip pain.   OBJECTIVE:       TODAY'S TREATMENT                                                                          DATE:  04/29/22.  Trigger Point Dry-Needling  Right vastus lateralis distal to incisional site. F/b Combo e'stim/US at 1.50 W/CM2 x 12 minutes to patient's right  ITB region f/b STW/M including IASTM  x 11 minutes to patient's proximal thigh musculature and along lateral right quads f/b  HMP to patient's right distal  ITB region at 80-150 Hz on 40% scan x 20 minutes.   ASSESSMENT:  CLINICAL IMPRESSION: Patient doing very well overall.  Continued improvement reported.  She continues to have some scar tissue but did very well with STW/M today.  Normal modality response following removal of modality. GOALS:  SHORT TERM GOALS: Target date: 05/13/2022  Ind with a HEP. Baseline: Goal status: INITIAL  LONG TERM GOALS: Target date: 06/10/2022   Perform ADL's with pain not > a 3/10. Baseline:  Goal status: INITIAL  2.  Reduce hypersensitivity by a subjective rating of 50% Baseline:  Goal status: INITIAL  3.  Return to normal exercise regimen with pain not > 2-3/10. Baseline:  Goal status: INITIAL   PLAN:  PT FREQUENCY: 2x/week  PT DURATION: 6 weeks  PLANNED INTERVENTIONS: Therapeutic exercises, Therapeutic activity, Neuromuscular re-education, Patient/Family  education, Self Care, Dry Needling, Electrical stimulation, Cryotherapy, Moist heat, scar mobilization, Vasopneumatic device, Ultrasound, Ionotophoresis '4mg'$ /ml Dexamethasone, and Manual therapy  PLAN FOR NEXT SESSION: FOTO.  Desensitization techniques, combo e'stim/US over right affected thigh (not hip), STW/M including IASTM, dry needling, recumbent bike/elliptical, knee extension machine.    Vidal Lampkins, [place], PT 04/29/2022, 4:50 PM

## 2022-05-03 ENCOUNTER — Ambulatory Visit: Payer: Medicare PPO | Admitting: Physical Therapy

## 2022-05-03 DIAGNOSIS — M545 Low back pain, unspecified: Secondary | ICD-10-CM | POA: Diagnosis not present

## 2022-05-03 DIAGNOSIS — M79651 Pain in right thigh: Secondary | ICD-10-CM | POA: Diagnosis not present

## 2022-05-03 NOTE — Therapy (Signed)
OUTPATIENT PHYSICAL THERAPY LOWER EXTREMITY TREATMENT   Patient Name: Misty Gomez MRN: [id number] DOB:02-Aug-1955, 66 y.o., female Today's Date: 04/29/2022   PT End of Session - 04/29/22 1649     Visit Number 8    Number of Visits 12    Date for PT Re-Evaluation 07/06/22    Authorization Type FOTO AT LEAST EVERY 5TH VISIT.  PROGRESS NOTE AT 10TH VISIT.  KX MODIFIER AFTER 15 VISITS.    PT Start Time 0315    PT Stop Time 0408    PT Time Calculation (min) 53 min    Activity Tolerance Patient tolerated treatment well    Behavior During Therapy WFL for tasks assessed/performed             Past Medical History:  Diagnosis Date   Anemia    Arthritis    Asthma    GERD (gastroesophageal reflux disease)    Hypothyroidism    Thyroid eye disease 2017   Vitamin D deficiency    Past Surgical History:  Procedure Laterality Date   BARTHOLIN GLAND CYST EXCISION     COLONOSCOPY N/A 01/06/2017   Procedure: COLONOSCOPY;  Surgeon: Rogene Houston, MD;  Location: [facility];  Service: Endoscopy;  Laterality: N/A;  930   POLYPECTOMY  01/06/2017   Procedure: POLYPECTOMY;  Surgeon: Rogene Houston, MD;  [facility]: [facility];  Service: Endoscopy;;  colon   Patient Active Problem List   Diagnosis Date Noted   History of asthma 04/14/2022   Mixed hyperlipidemia 04/14/2022   Age-related cataract of both eyes 04/14/2022   Bradycardia 04/14/2022   Thyroid eye disease 04/14/2022   Urge incontinence 04/14/2022   Vitamin D deficiency 02/24/2018   Anemia 02/24/2018   Postablative hypothyroidism 02/24/2018   Osteopenia of lumbar spine 02/24/2018   Special screening for malignant neoplasms, colon 09/09/2016   Other reasons for seeking consultation 12/06/2013     REFERRING PROVIDER: Jac Canavan  REFERRING DIAG: Left hip pain.  THERAPY DIAG:  Pain in right thigh  Rationale for Evaluation and Treatment Rehabilitation  ONSET DATE: 08/17/21.  SUBJECTIVE:   SUBJECTIVE  STATEMENT: A dog hit it's tail on my knee.  Okay now. PERTINENT HISTORY: Right THA PAIN:  Are you having pain? Yes: NPRS scale: 1/10 Pain location: Right hip and thigh. Pain description: As above. Aggravating factors: As above. Relieving factors: As above.  PRECAUTIONS: Anterior hip.  No ultrasound over right hip.  WEIGHT BEARING RESTRICTIONS: No  FALLS:  Has patient fallen in last 6 months? No  LIVING ENVIRONMENT: Lives with: lives with their spouse Lives in: House/apartment Has following equipment at home: None  OCCUPATION: Retired.  PLOF: Independent  PATIENT GOALS: Get back to her active lifestyle without hip pain.   OBJECTIVE:       TODAY'S TREATMENT                                                                          DATE:  05/03/22.   Combo e'stim/US at 1.50 W/CM2 x 10 minutes to patient's right  ITB region f/b STW/M including IASTM  x 6 minutes to patient's proximal thigh musculature and along lateral right quads f/b  HMP to patient's right distal ITB region  at 80-150 Hz on 40% scan x 15 minutes f/b iontophoresis at 80 mA-Min to right distal ITB.  ASSESSMENT:  CLINICAL IMPRESSION: Slight flare-up due to a dog wagging it's tail and hitting her in the area of the right distal ITB.  Patient is responding very well to treatments and reports significant improvement.   GOALS:  SHORT TERM GOALS: Target date: 05/13/2022  Ind with a HEP. Baseline: Goal status: INITIAL  LONG TERM GOALS: Target date: 06/10/2022   Perform ADL's with pain not > a 3/10. Baseline:  Goal status: INITIAL  2.  Reduce hypersensitivity by a subjective rating of 50% Baseline:  Goal status: INITIAL  3.  Return to normal exercise regimen with pain not > 2-3/10. Baseline:  Goal status: INITIAL   PLAN:  PT FREQUENCY: 2x/week  PT DURATION: 6 weeks  PLANNED INTERVENTIONS: Therapeutic exercises, Therapeutic activity, Neuromuscular re-education, Patient/Family education, Self  Care, Dry Needling, Electrical stimulation, Cryotherapy, Moist heat, scar mobilization, Vasopneumatic device, Ultrasound, Ionotophoresis '4mg'$ /ml Dexamethasone, and Manual therapy  PLAN FOR NEXT SESSION: FOTO.  Desensitization techniques, combo e'stim/US over right affected thigh (not hip), STW/M including IASTM, dry needling, recumbent bike/elliptical, knee extension machine.    Charlynn Salih, [place], PT 04/29/2022, 4:50 PM

## 2022-05-08 DIAGNOSIS — E0500 Thyrotoxicosis with diffuse goiter without thyrotoxic crisis or storm: Secondary | ICD-10-CM | POA: Diagnosis not present

## 2022-05-10 ENCOUNTER — Ambulatory Visit: Payer: Medicare PPO | Admitting: Physical Therapy

## 2022-05-10 DIAGNOSIS — M545 Low back pain, unspecified: Secondary | ICD-10-CM | POA: Diagnosis not present

## 2022-05-10 DIAGNOSIS — M79651 Pain in right thigh: Secondary | ICD-10-CM

## 2022-05-10 NOTE — Therapy (Addendum)
OUTPATIENT PHYSICAL THERAPY LOWER EXTREMITY TREATMENT   Patient Name: Misty Gomez MRN: [id number] DOB:04/25/1956, 66 y.o., female Today's Date: 04/29/2022   PT End of Session - 04/29/22 1649     Visit Number 8    Number of Visits 12    Date for PT Re-Evaluation 07/06/22    Authorization Type FOTO AT LEAST EVERY 5TH VISIT.  PROGRESS NOTE AT 10TH VISIT.  KX MODIFIER AFTER 15 VISITS.    PT Start Time 0315    PT Stop Time 0408    PT Time Calculation (min) 53 min    Activity Tolerance Patient tolerated treatment well    Behavior During Therapy WFL for tasks assessed/performed             Past Medical History:  Diagnosis Date   Anemia    Arthritis    Asthma    GERD (gastroesophageal reflux disease)    Hypothyroidism    Thyroid eye disease 2017   Vitamin D deficiency    Past Surgical History:  Procedure Laterality Date   BARTHOLIN GLAND CYST EXCISION     COLONOSCOPY N/A 01/06/2017   Procedure: COLONOSCOPY;  Surgeon: Rogene Houston, MD;  [facility]: [facility];  Service: Endoscopy;  Laterality: N/A;  930   POLYPECTOMY  01/06/2017   Procedure: POLYPECTOMY;  Surgeon: Rogene Houston, MD;  [facility]: [facility];  Service: Endoscopy;;  colon   Patient Active Problem List   Diagnosis Date Noted   History of asthma 04/14/2022   Mixed hyperlipidemia 04/14/2022   Age-related cataract of both eyes 04/14/2022   Bradycardia 04/14/2022   Thyroid eye disease 04/14/2022   Urge incontinence 04/14/2022   Vitamin D deficiency 02/24/2018   Anemia 02/24/2018   Postablative hypothyroidism 02/24/2018   Osteopenia of lumbar spine 02/24/2018   Special screening for malignant neoplasms, colon 09/09/2016   Other reasons for seeking consultation 12/06/2013     REFERRING PROVIDER: Jac Canavan  REFERRING DIAG: Left hip pain.  THERAPY DIAG:  Pain in right thigh  Rationale for Evaluation and Treatment Rehabilitation  ONSET DATE: 08/17/21.  SUBJECTIVE:   SUBJECTIVE  STATEMENT: A dog hit it's tail on my knee.  Okay now. PERTINENT HISTORY: Right THA PAIN:  Are you having pain? Yes: NPRS scale: 1/10 Pain location: Right hip and thigh. Pain description: As above. Aggravating factors: As above. Relieving factors: As above.  PRECAUTIONS: Anterior hip.  No ultrasound over right hip.  WEIGHT BEARING RESTRICTIONS: No  FALLS:  Has patient fallen in last 6 months? No  LIVING ENVIRONMENT: Lives with: lives with their spouse Lives in: House/apartment Has following equipment at home: None  OCCUPATION: Retired.  PLOF: Independent  PATIENT GOALS: Get back to her active lifestyle without hip pain.   OBJECTIVE:       TODAY'S TREATMENT                                                                          DATE:  05/10/22.   Combo e'stim/US at 1.50 W/CM2 x 8 minutes to patient's right  distal ITB region f/b STW/M including IASTM  x 8 minutes to patient's proximal thigh musculature and along lateral right quads f/b iontophoresis at 80 mA-Min f/b  HMP  to patient's right distal ITB region at 80-150 Hz on 40% scan x 15 minutes f/b iontophoresis at 80 mA-Min to right distal ITB.  ASSESSMENT:  CLINICAL IMPRESSION: Palpable pain localized to right distal ITB but much improved since starting PT. GOALS:  SHORT TERM GOALS: Target date: 05/13/2022  Ind with a HEP. Baseline: Goal status: MET.  Doing SDLY hip abduction.  LONG TERM GOALS: Target date: 06/10/2022   Perform ADL's with pain not > a 3/10. Baseline:  Goal status: Partially met.  2.  Reduce hypersensitivity by a subjective rating of 50% Baseline:  Goal status: MET.  3.  Return to normal exercise regimen with pain not > 2-3/10. Baseline:  Goal status: Mostly met.  PLAN:  PT FREQUENCY: 2x/week  PT DURATION: 6 weeks  PLANNED INTERVENTIONS: Therapeutic exercises, Therapeutic activity, Neuromuscular re-education, Patient/Family education, Self Care, Dry Needling, Electrical  stimulation, Cryotherapy, Moist heat, scar mobilization, Vasopneumatic device, Ultrasound, Ionotophoresis 39m/ml Dexamethasone, and Manual therapy  PLAN FOR NEXT SESSION: FOTO.  Desensitization techniques, combo e'stim/US over right affected thigh (not hip), STW/M including IASTM, dry needling, recumbent bike/elliptical, knee extension machine.   Progress Note Reporting Period 04/07/22 to 05/10/22.  See note below for Objective Data and Assessment of Progress/Goals. Excellent progress toward goals.    Dale Strausser, [place] PT 04/29/2022, 4:50 PM

## 2022-05-14 ENCOUNTER — Ambulatory Visit: Payer: Medicare PPO | Attending: Nurse Practitioner | Admitting: Physical Therapy

## 2022-05-14 ENCOUNTER — Encounter: Payer: Self-pay | Admitting: Physical Therapy

## 2022-05-14 DIAGNOSIS — M79651 Pain in right thigh: Secondary | ICD-10-CM | POA: Diagnosis not present

## 2022-05-14 NOTE — Therapy (Signed)
OUTPATIENT PHYSICAL THERAPY LOWER EXTREMITY TREATMENT   Patient Name: Misty Gomez MRN: [id number] DOB:December 25, 1955, 66 y.o., female Today's Date: 05/14/2022   PT End of Session - 05/14/22 1134     Visit Number 11    Number of Visits 12    Date for PT Re-Evaluation 07/06/22    Authorization Type FOTO AT LEAST EVERY 5TH VISIT.  PROGRESS NOTE AT 10TH VISIT.  KX MODIFIER AFTER 15 VISITS.    PT Start Time 0945    PT Stop Time 1033    PT Time Calculation (min) 48 min             Past Medical History:  Diagnosis Date   Anemia    Arthritis    Asthma    GERD (gastroesophageal reflux disease)    Hypothyroidism    Thyroid eye disease 2017   Vitamin D deficiency    Past Surgical History:  Procedure Laterality Date   BARTHOLIN GLAND CYST EXCISION     COLONOSCOPY N/A 01/06/2017   Procedure: COLONOSCOPY;  Surgeon: Rogene Houston, MD;  [facility];  Service: Endoscopy;  Laterality: N/A;  930   POLYPECTOMY  01/06/2017   Procedure: POLYPECTOMY;  Surgeon: Rogene Houston, MD;  Location: [facility];  Service: Endoscopy;;  colon   Patient Active Problem List   Diagnosis Date Noted   History of asthma 04/14/2022   Mixed hyperlipidemia 04/14/2022   Age-related cataract of both eyes 04/14/2022   Bradycardia 04/14/2022   Thyroid eye disease 04/14/2022   Urge incontinence 04/14/2022   Vitamin D deficiency 02/24/2018   Anemia 02/24/2018   Postablative hypothyroidism 02/24/2018   Osteopenia of lumbar spine 02/24/2018   Special screening for malignant neoplasms, colon 09/09/2016   Other reasons for seeking consultation 12/06/2013     REFERRING PROVIDER: Jac Canavan  REFERRING DIAG: Left hip pain.  THERAPY DIAG:  Pain in right thigh  Rationale for Evaluation and Treatment Rehabilitation  ONSET DATE: 08/17/21.  SUBJECTIVE:   SUBJECTIVE STATEMENT: Pain higher today due to cold weather. PERTINENT HISTORY: Right THA PAIN:  Are you having pain? Yes: NPRS  scale: 3/10 Pain location: Right hip and thigh. Pain description: As above. Aggravating factors: As above. Relieving factors: As above.  PRECAUTIONS: Anterior hip.  No ultrasound over right hip.  WEIGHT BEARING RESTRICTIONS: No  FALLS:  Has patient fallen in last 6 months? No  LIVING ENVIRONMENT: Lives with: lives with their spouse Lives in: House/apartment Has following equipment at home: None  OCCUPATION: Retired.  PLOF: Independent  PATIENT GOALS: Get back to her active lifestyle without hip pain.   OBJECTIVE:       TODAY'S TREATMENT                                                                          DATE:  05/14/22.   Combo e'stim/US at 1.50 W/CM2 x 8 minutes to patient's right  distal ITB region f/b STW/M  x 8 minutes to patient's proximal thigh musculature and along lateral right quads f/b iontophoresis at 80 mA-Min f/b  HMP to patient's right distal ITB region at 80-150 Hz on 40% scan x 15 minutes f/b iontophoresis at 80 mA-Min to right distal ITB.  ASSESSMENT:  CLINICAL IMPRESSION: Patient with some pain increase which she feels is related to cold weather.  Overall,much better.  One visit remaining. GOALS:  SHORT TERM GOALS: Target date: 05/28/2022  Ind with a HEP. Baseline: Goal status: MET.  Doing SDLY hip abduction.  LONG TERM GOALS: Target date: 06/25/2022   Perform ADL's with pain not > a 3/10. Baseline:  Goal status: Partially met.  2.  Reduce hypersensitivity by a subjective rating of 50% Baseline:  Goal status: MET.  3.  Return to normal exercise regimen with pain not > 2-3/10. Baseline:  Goal status: Mostly met.  PLAN:  PT FREQUENCY: 2x/week  PT DURATION: 6 weeks  PLANNED INTERVENTIONS: Therapeutic exercises, Therapeutic activity, Neuromuscular re-education, Patient/Family education, Self Care, Dry Needling, Electrical stimulation, Cryotherapy, Moist heat, scar mobilization, Vasopneumatic device, Ultrasound, Ionotophoresis 32m/ml  Dexamethasone, and Manual therapy  PLAN FOR NEXT SESSION: FOTO.  Desensitization techniques, combo e'stim/US over right affected thigh (not hip), STW/M including IASTM, dry needling, recumbent bike/elliptical, knee extension machine.   Progress Note Reporting Period 04/07/22 to 05/10/22.  See note below for Objective Data and Assessment of Progress/Goals. Excellent progress toward goals.    Janeliz Prestwood, CMali PT 05/14/2022, 11:37 AM

## 2022-05-17 ENCOUNTER — Ambulatory Visit (INDEPENDENT_AMBULATORY_CARE_PROVIDER_SITE_OTHER): Payer: Medicare PPO | Admitting: Family Medicine

## 2022-05-17 ENCOUNTER — Encounter: Payer: Self-pay | Admitting: Family Medicine

## 2022-05-17 DIAGNOSIS — E079 Disorder of thyroid, unspecified: Secondary | ICD-10-CM | POA: Diagnosis not present

## 2022-05-17 DIAGNOSIS — H5789 Other specified disorders of eye and adnexa: Secondary | ICD-10-CM | POA: Diagnosis not present

## 2022-05-17 DIAGNOSIS — R053 Chronic cough: Secondary | ICD-10-CM | POA: Diagnosis not present

## 2022-05-17 NOTE — Progress Notes (Signed)
Telephone visit  Subjective: CC: f/u cough PCP: Janora Norlander, DO WYO:VZCHY D Westbrook is a 66 y.o. female calls for telephone consult today. Patient provides verbal consent for consult held via phone.  Due to COVID-19 pandemic this visit was conducted virtually. This visit type was conducted due to national recommendations for restrictions regarding the COVID-19 Pandemic (e.g. social distancing, sheltering in place) in an effort to limit this patient's exposure and mitigate transmission in our community. All issues noted in this document were discussed and addressed.  A physical exam was not performed with this format.   Location of patient: home Location of provider: [facility] Others present for call: none  1. Peristent cough Patient reports resolution about 2.5 weeks ago.  Stopped the singulair and continues to do well.  No SOB, wheezing.   2. Thyroid eye disease w/ cataract Has cataract surgery scheduled this week.  Doing well now that cough resolved.   ROS: Per HPI  Allergies  Allergen Reactions   Relafen [Nabumetone] Rash    All over   Past Medical History:  Diagnosis Date   Anemia    Arthritis    Asthma    GERD (gastroesophageal reflux disease)    Hypothyroidism    Thyroid eye disease 2017   Vitamin D deficiency     Current Outpatient Medications:    acetaminophen (TYLENOL) 500 MG tablet, , Disp: , Rfl:    albuterol (VENTOLIN HFA) 108 (90 Base) MCG/ACT inhaler, Inhale 1-2 puffs into the lungs every 4 (four) hours as needed for wheezing or shortness of breath. (NEEDS TO BE SEEN BEFORE NEXT REFILL), Disp: 6.7 each, Rfl: 0   albuterol (VENTOLIN HFA) 108 (90 Base) MCG/ACT inhaler, Inhale 1-2 puffs into the lungs every 4 (four) hours as needed for wheezing or shortness of breath., Disp: 1 each, Rfl: 0   benzonatate (TESSALON) 200 MG capsule, Take 1 capsule (200 mg total) by mouth 3 (three) times daily as needed., Disp: 30 capsule, Rfl: 1   cetirizine (ZYRTEC) 10 MG  tablet, Take 1 tablet (10 mg total) by mouth daily. (Patient not taking: Reported on 04/07/2022), Disp: 30 tablet, Rfl: 11   levothyroxine (SYNTHROID) 125 MCG tablet, Take 1 tablet (125 mcg total) by mouth daily., Disp: 90 tablet, Rfl: 3   methocarbamol (ROBAXIN) 500 MG tablet, Take 0.5-1 tablets (250-500 mg total) by mouth every 6 (six) hours as needed for muscle spasms., Disp: 40 tablet, Rfl: 0   montelukast (SINGULAIR) 10 MG tablet, Take 1 tablet (10 mg total) by mouth at bedtime., Disp: 90 tablet, Rfl: 3   omeprazole (PRILOSEC) 20 MG capsule, Take 20 mg by mouth daily., Disp: , Rfl:    Pregabalin (LYRICA PO), Take by mouth., Disp: , Rfl:   Assessment/ Plan: 66 y.o. female   Persistent cough  Thyroid eye disease  Cough resolved. Ok to use singulair prn.  Cataract surgery scheduled this week. Will plan to reconvene in 27munless pt needs me sooner.  Start time: 12:28pm End time: 12:33pm  Total time spent on patient care (including telephone call/ virtual visit): 5 minutes  [facility] [place]([phone]

## 2022-05-18 ENCOUNTER — Ambulatory Visit: Payer: Medicare PPO | Admitting: Physical Therapy

## 2022-05-18 DIAGNOSIS — M79651 Pain in right thigh: Secondary | ICD-10-CM

## 2022-05-18 NOTE — Therapy (Signed)
OUTPATIENT PHYSICAL THERAPY LOWER EXTREMITY TREATMENT   Patient Name: Misty Gomez MRN: [id number] DOB:[birth year], 66 y.o., female Today's Date: 05/18/2022   PT End of Session - 05/18/22 1029     Visit Number 12    Number of Visits 12    Date for PT Re-Evaluation 07/06/22    Authorization Type FOTO AT LEAST EVERY 5TH VISIT.  PROGRESS NOTE AT 10TH VISIT.  KX MODIFIER AFTER 15 VISITS.    PT Start Time 0900    PT Stop Time 0954    PT Time Calculation (min) 54 min    Activity Tolerance Patient tolerated treatment well    Behavior During Therapy WFL for tasks assessed/performed             Past Medical History:  Diagnosis Date   Anemia    Arthritis    Asthma    GERD (gastroesophageal reflux disease)    Hypothyroidism    Thyroid eye disease 2017   Vitamin D deficiency    Past Surgical History:  Procedure Laterality Date   BARTHOLIN GLAND CYST EXCISION     COLONOSCOPY N/A 01/06/2017   Procedure: COLONOSCOPY;  Surgeon: Rogene Houston, MD;  [facility];  Service: Endoscopy;  Laterality: N/A;  930   POLYPECTOMY  01/06/2017   Procedure: POLYPECTOMY;  Surgeon: Rogene Houston, MD;  [facility];  Service: Endoscopy;;  colon   Patient Active Problem List   Diagnosis Date Noted   History of asthma 04/14/2022   Mixed hyperlipidemia 04/14/2022   Age-related cataract of both eyes 04/14/2022   Bradycardia 04/14/2022   Thyroid eye disease 04/14/2022   Urge incontinence 04/14/2022   Vitamin D deficiency 02/24/2018   Anemia 02/24/2018   Postablative hypothyroidism 02/24/2018   Osteopenia of lumbar spine 02/24/2018   Special screening for malignant neoplasms, colon 09/09/2016   Other reasons for seeking consultation 12/06/2013     REFERRING PROVIDER: Jac Canavan  REFERRING DIAG: Left hip pain.  THERAPY DIAG:  Pain in right thigh  Rationale for Evaluation and Treatment Rehabilitation  ONSET DATE: 08/17/21.  SUBJECTIVE:   SUBJECTIVE  STATEMENT: Pain higher today due to cold weather. PERTINENT HISTORY: Right THA PAIN:  Are you having pain? Yes: NPRS scale: 1/10 Pain location: Right hip and thigh. Pain description: As above. Aggravating factors: As above. Relieving factors: As above.  PRECAUTIONS: Anterior hip.  No ultrasound over right hip.  WEIGHT BEARING RESTRICTIONS: No  FALLS:  Has patient fallen in last 6 months? No  LIVING ENVIRONMENT: Lives with: lives with their spouse Lives in: House/apartment Has following equipment at home: None  OCCUPATION: Retired.  PLOF: Independent  PATIENT GOALS: Get back to her active lifestyle without hip pain.   OBJECTIVE:       TODAY'S TREATMENT                                                                          DATE:  05/18/22.   Combo e'stim/US at 1.50 W/CM2 x 12 minutes to patient's right  distal ITB region f/b STW/M  x 11 minutes to patient's distal thigh musculature and along lateral right quads f/b HMP and IFC at 80-150 Hz on 40% scan x 20 minutes.ASSESSMENT:  CLINICAL IMPRESSION: See discharge summary.  GOALS:  SHORT TERM GOALS: Target date: 06/01/2022  Ind with a HEP. Baseline: Goal status: MET.  Doing SDLY hip abduction.  LONG TERM GOALS: Target date: 06/29/2022   Perform ADL's with pain not > a 3/10. Baseline:  Goal status: MET.  2.  Reduce hypersensitivity by a subjective rating of 50% Baseline:  Goal status: MET.  3.  Return to normal exercise regimen with pain not > 2-3/10. Baseline:  Goal status: MET.  PLAN:  PT FREQUENCY: 2x/week  PT DURATION: 6 weeks  PLANNED INTERVENTIONS: Therapeutic exercises, Therapeutic activity, Neuromuscular re-education, Patient/Family education, Self Care, Dry Needling, Electrical stimulation, Cryotherapy, Moist heat, scar mobilization, Vasopneumatic device, Ultrasound, Ionotophoresis 48m/ml Dexamethasone, and Manual therapy  PLAN FOR NEXT SESSION: FOTO.  Desensitization techniques, combo  e'stim/US over right affected thigh (not hip), STW/M including IASTM, dry needling, recumbent bike/elliptical, knee extension machine.   Progress Note .PHYSICAL THERAPY DISCHARGE SUMMARY  Visits from Start of Care: 12.  Current functional level related to goals / functional outcomes: Patient very pleased with progress.  Will have some pain at the end of a long busy day.   Remaining deficits: All goals met.     Education / Equipment: HEP.   Patient agrees to discharge. Patient goals were met. Patient is being discharged due to being pleased with the current functional level.    Wilmary Levit, CMali PT 05/18/2022, 10:32 AM

## 2022-05-20 DIAGNOSIS — H25811 Combined forms of age-related cataract, right eye: Secondary | ICD-10-CM | POA: Diagnosis not present

## 2022-05-25 DIAGNOSIS — H25812 Combined forms of age-related cataract, left eye: Secondary | ICD-10-CM | POA: Diagnosis not present

## 2022-05-25 DIAGNOSIS — Z961 Presence of intraocular lens: Secondary | ICD-10-CM | POA: Diagnosis not present

## 2022-05-27 DIAGNOSIS — H2512 Age-related nuclear cataract, left eye: Secondary | ICD-10-CM | POA: Diagnosis not present

## 2022-05-27 DIAGNOSIS — H25811 Combined forms of age-related cataract, right eye: Secondary | ICD-10-CM | POA: Diagnosis not present

## 2022-07-04 NOTE — Patient Instructions (Signed)
Misty Gomez , Thank you for taking time to come for your Medicare Wellness Visit. I appreciate your ongoing commitment to your health goals. Please review the following plan we discussed and let me know if I can assist you in the future.   These are the goals we discussed:  Goals      Exercise 150 min/wk Moderate Activity        This is a list of the screening recommended for you and due dates:  Health Maintenance  Topic Date Due   COVID-19 Vaccine (5 - 2023-24 season) 02/12/2022   Zoster (Shingles) Vaccine (2 of 2) 06/09/2022   Flu Shot  09/12/2022*   Pneumonia Vaccine (1 - PCV) 04/15/2023*   Hepatitis C Screening: USPSTF Recommendation to screen - Ages 18-79 yo.  04/15/2023*   Medicare Annual Wellness Visit  05/28/2023*   Mammogram  10/27/2023   Colon Cancer Screening  01/07/2027   DTaP/Tdap/Td vaccine (2 - Td or Tdap) 03/18/2030   DEXA scan (bone density measurement)  Completed   HPV Vaccine  Aged Out  *Topic was postponed. The date shown is not the original due date.    Advanced directives: We have a copy of your advanced directives available in your record should your provider ever need to access them.   Conditions/risks identified: Aim for 30 minutes of exercise or brisk walking, 6-8 glasses of water, and 5 servings of fruits and vegetables each day.   Next appointment: Follow up in one year for your annual wellness visit    Preventive Care 67 Years and Older, Female Preventive care refers to lifestyle choices and visits with your health care provider that can promote health and wellness. What does preventive care include? A yearly physical exam. This is also called an annual well check. Dental exams once or twice a year. Routine eye exams. Ask your health care provider how often you should have your eyes checked. Personal lifestyle choices, including: Daily care of your teeth and gums. Regular physical activity. Eating a healthy diet. Avoiding tobacco and drug  use. Limiting alcohol use. Practicing safe sex. Taking low-dose aspirin every day. Taking vitamin and mineral supplements as recommended by your health care provider. What happens during an annual well check? The services and screenings done by your health care provider during your annual well check will depend on your age, overall health, lifestyle risk factors, and family history of disease. Counseling  Your health care provider may ask you questions about your: Alcohol use. Tobacco use. Drug use. Emotional well-being. Home and relationship well-being. Sexual activity. Eating habits. History of falls. Memory and ability to understand (cognition). Work and work Statistician. Reproductive health. Screening  You may have the following tests or measurements: Height, weight, and BMI. Blood pressure. Lipid and cholesterol levels. These may be checked every 5 years, or more frequently if you are over 1 years old. Skin check. Lung cancer screening. You may have this screening every year starting at age 22 if you have a 30-pack-year history of smoking and currently smoke or have quit within the past 15 years. Fecal occult blood test (FOBT) of the stool. You may have this test every year starting at age 54. Flexible sigmoidoscopy or colonoscopy. You may have a sigmoidoscopy every 5 years or a colonoscopy every 10 years starting at age 49. Hepatitis C blood test. Hepatitis B blood test. Sexually transmitted disease (STD) testing. Diabetes screening. This is done by checking your blood sugar (glucose) after you have not eaten for  a while (fasting). You may have this done every 1-3 years. Bone density scan. This is done to screen for osteoporosis. You may have this done starting at age 17. Mammogram. This may be done every 1-2 years. Talk to your health care provider about how often you should have regular mammograms. Talk with your health care provider about your test results, treatment  options, and if necessary, the need for more tests. Vaccines  Your health care provider may recommend certain vaccines, such as: Influenza vaccine. This is recommended every year. Tetanus, diphtheria, and acellular pertussis (Tdap, Td) vaccine. You may need a Td booster every 10 years. Zoster vaccine. You may need this after age 42. Pneumococcal 13-valent conjugate (PCV13) vaccine. One dose is recommended after age 26. Pneumococcal polysaccharide (PPSV23) vaccine. One dose is recommended after age 70. Talk to your health care provider about which screenings and vaccines you need and how often you need them. This information is not intended to replace advice given to you by your health care provider. Make sure you discuss any questions you have with your health care provider. Document Released: 06/27/2015 Document Revised: 02/18/2016 Document Reviewed: 04/01/2015 Elsevier Interactive Patient Education  2017 [place] Prevention in the Home Falls can cause injuries. They can happen to people of all ages. There are many things you can do to make your home safe and to help prevent falls. What can I do on the outside of my home? Regularly fix the edges of walkways and driveways and fix any cracks. Remove anything that might make you trip as you walk through a door, such as a raised step or threshold. Trim any bushes or trees on the path to your home. Use bright outdoor lighting. Clear any walking paths of anything that might make someone trip, such as rocks or tools. Regularly check to see if handrails are loose or broken. Make sure that both sides of any steps have handrails. Any raised decks and porches should have guardrails on the edges. Have any leaves, snow, or ice cleared regularly. Use sand or salt on walking paths during winter. Clean up any spills in your garage right away. This includes oil or grease spills. What can I do in the bathroom? Use night lights. Install grab  bars by the toilet and in the tub and shower. Do not use towel bars as grab bars. Use non-skid mats or decals in the tub or shower. If you need to sit down in the shower, use a plastic, non-slip stool. Keep the floor dry. Clean up any water that spills on the floor as soon as it happens. Remove soap buildup in the tub or shower regularly. Attach bath mats securely with double-sided non-slip rug tape. Do not have throw rugs and other things on the floor that can make you trip. What can I do in the bedroom? Use night lights. Make sure that you have a light by your bed that is easy to reach. Do not use any sheets or blankets that are too big for your bed. They should not hang down onto the floor. Have a firm chair that has side arms. You can use this for support while you get dressed. Do not have throw rugs and other things on the floor that can make you trip. What can I do in the kitchen? Clean up any spills right away. Avoid walking on wet floors. Keep items that you use a lot in easy-to-reach places. If you need to reach something above  you, use a strong step stool that has a grab bar. Keep electrical cords out of the way. Do not use floor polish or wax that makes floors slippery. If you must use wax, use non-skid floor wax. Do not have throw rugs and other things on the floor that can make you trip. What can I do with my stairs? Do not leave any items on the stairs. Make sure that there are handrails on both sides of the stairs and use them. Fix handrails that are broken or loose. Make sure that handrails are as long as the stairways. Check any carpeting to make sure that it is firmly attached to the stairs. Fix any carpet that is loose or worn. Avoid having throw rugs at the top or bottom of the stairs. If you do have throw rugs, attach them to the floor with carpet tape. Make sure that you have a light switch at the top of the stairs and the bottom of the stairs. If you do not have them,  ask someone to add them for you. What else can I do to help prevent falls? Wear shoes that: Do not have high heels. Have rubber bottoms. Are comfortable and fit you well. Are closed at the toe. Do not wear sandals. If you use a stepladder: Make sure that it is fully opened. Do not climb a closed stepladder. Make sure that both sides of the stepladder are locked into place. Ask someone to hold it for you, if possible. Clearly mark and make sure that you can see: Any grab bars or handrails. First and last steps. Where the edge of each step is. Use tools that help you move around (mobility aids) if they are needed. These include: Canes. Walkers. Scooters. Crutches. Turn on the lights when you go into a dark area. Replace any light bulbs as soon as they burn out. Set up your furniture so you have a clear path. Avoid moving your furniture around. If any of your floors are uneven, fix them. If there are any pets around you, be aware of where they are. Review your medicines with your doctor. Some medicines can make you feel dizzy. This can increase your chance of falling. Ask your doctor what other things that you can do to help prevent falls. This information is not intended to replace advice given to you by your health care provider. Make sure you discuss any questions you have with your health care provider. Document Released: 03/27/2009 Document Revised: 11/06/2015 Document Reviewed: 07/05/2014 Elsevier Interactive Patient Education  2017 [employer].

## 2022-07-04 NOTE — Progress Notes (Addendum)
Subjective:   Misty Gomez is a 67 y.o. female who presents for an Initial Medicare Annual Wellness Visit.  I connected with  Misty Gomez on 07/05/22 by a audio enabled telemedicine application and verified that I am speaking with the correct person using two identifiers.  Patient Location: Home  Provider Location: Home Office  I discussed the limitations of evaluation and management by telemedicine. The patient expressed understanding and agreed to proceed.  Review of Systems     Cardiac Risk Factors include: advanced age (>66mn, >>10women);dyslipidemia     Objective:    Today's Vitals   07/05/22 1403  Weight: 177 lb (80.3 kg)  Height: '5\' 6"'$  (1.676 m)   Body mass index is 28.57 kg/m.     07/05/2022    2:12 PM 04/07/2022   10:26 AM 04/01/2021   10:06 AM 03/04/2021   10:10 AM 01/06/2017    8:41 AM  Advanced Directives  Does Patient Have a Medical Advance Directive? Yes Yes Yes Yes Yes  Type of Advance Directive Living will;Healthcare Power of Attorney  Out of facility DNR (pink MOST or yellow form);Living will    Does patient want to make changes to medical advance directive? No - Patient declined      [place]? Yes - validated most recent copy scanned in chart (See row information)        Current Medications (verified) Outpatient Encounter Medications as of 07/05/2022  Medication Sig   acetaminophen (TYLENOL) 500 MG tablet    albuterol (VENTOLIN HFA) 108 (90 Base) MCG/ACT inhaler Inhale 1-2 puffs into the lungs every 4 (four) hours as needed for wheezing or shortness of breath.   cetirizine (ZYRTEC) 10 MG tablet Take 1 tablet (10 mg total) by mouth daily.   levothyroxine (SYNTHROID) 125 MCG tablet Take 1 tablet (125 mcg total) by mouth daily.   methocarbamol (ROBAXIN) 500 MG tablet Take 0.5-1 tablets (250-500 mg total) by mouth every 6 (six) hours as needed for muscle spasms.   montelukast (SINGULAIR) 10 MG tablet Take 1  tablet (10 mg total) by mouth at bedtime.   omeprazole (PRILOSEC) 20 MG capsule Take 20 mg by mouth daily.   Pregabalin (LYRICA PO) Take by mouth.   [DISCONTINUED] albuterol (VENTOLIN HFA) 108 (90 Base) MCG/ACT inhaler Inhale 1-2 puffs into the lungs every 4 (four) hours as needed for wheezing or shortness of breath. (NEEDS TO BE SEEN BEFORE NEXT REFILL)   [DISCONTINUED] benzonatate (TESSALON) 200 MG capsule Take 1 capsule (200 mg total) by mouth 3 (three) times daily as needed.   No facility-administered encounter medications on file as of 07/05/2022.    Allergies (verified) Relafen [nabumetone]   History: Past Medical History:  Diagnosis Date   Anemia    Arthritis    Asthma    GERD (gastroesophageal reflux disease)    Hypothyroidism    Thyroid eye disease 2017   Vitamin D deficiency    Past Surgical History:  Procedure Laterality Date   BARTHOLIN GLAND CYST EXCISION     COLONOSCOPY N/A 01/06/2017   Procedure: COLONOSCOPY;  Surgeon: RRogene Houston MD;  [facility];  Service: Endoscopy;  Laterality: N/A;  930   POLYPECTOMY  01/06/2017   Procedure: POLYPECTOMY;  Surgeon: RRogene Houston MD;  Location: [facility];  Service: Endoscopy;;  colon   Family History  Problem Relation Age of Onset   CVA Mother 633  Rheum arthritis Mother  Heart attack Mother 81   Heart disease Mother    Heart attack Father 68       had first heart attack in early 30s   Heart disease Father    Cancer Brother    Heart disease Brother    Cancer Brother    Diabetes Brother    Hypertension Brother    Pancreatic disease Maternal Grandmother    Diabetes Maternal Grandmother    Asthma Maternal Grandfather    Heart attack Maternal Grandfather    Heart attack Paternal Grandmother    Heart attack Paternal Grandfather    Social History   Socioeconomic History   Marital status: Married    Spouse name: Not on file   Number of children: 0   Years of education: Not on file    Highest education level: Not on file  Occupational History   Not on file  Tobacco Use   Smoking status: Never   Smokeless tobacco: Never  Vaping Use   Vaping Use: Never used  Substance and Sexual Activity   Alcohol use: No   Drug use: No   Sexual activity: Not Currently  Other Topics Concern   Not on file  Social History Narrative   Misty Gomez is a 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   She walks several times weekly.   Social Determinants of Health   Financial Resource Strain: Low Risk  (07/05/2022)   Overall Financial Resource Strain (CARDIA)    Difficulty of Paying Living Expenses: Not hard at all  Food Insecurity: No Food Insecurity (07/05/2022)   Hunger Vital Sign    Worried About Running Out of Food in the Last Year: Never true    Ran Out of Food in the Last Year: Never true  Transportation Needs: No Transportation Needs (07/05/2022)   PRAPARE - Hydrologist (Medical): No    Lack of Transportation (Non-Medical): No  Physical Activity: Insufficiently Active (07/05/2022)   Exercise Vital Sign    Days of Exercise per Week: 3 days    Minutes of Exercise per Session: 30 min  Stress: No Stress Concern Present (07/05/2022)   [place]    Feeling of Stress : Not at all  Social Connections: [place] (07/05/2022)   Social Connection and Isolation Panel [NHANES]    Frequency of Communication with Friends and Family: More than three times a week    Frequency of Social Gatherings with Friends and Family: Three times a week    Attends Religious Services: More than 4 times per year    Active Member of Clubs or Organizations: Yes    Attends Music therapist: More than 4 times per year    Marital Status: Married    Tobacco Counseling Counseling given: Not Answered   Clinical Intake:  Pre-visit preparation  completed: Yes  Pain : No/denies pain     Diabetes: No  How often do you need to have someone help you when you read instructions, pamphlets, or other written materials from your doctor or pharmacy?: 1 - Never  Diabetic?No   Interpreter Needed?: No  Information entered by :: Denman George LPN   Activities of Daily Living    07/05/2022    2:12 PM  In your present state of health, do you have any difficulty performing the following activities:  Hearing? 0  Vision? 0  Difficulty concentrating  or making decisions? 0  Walking or climbing stairs? 0  Dressing or bathing? 0  Doing errands, shopping? 0  Preparing Food and eating ? N  Using the Toilet? N  In the past six months, have you accidently leaked urine? N  Do you have problems with loss of bowel control? N  Managing your Medications? N  Managing your Finances? N  Housekeeping or managing your Housekeeping? N    Patient Care Team: Janora Norlander, DO as PCP - General (Family Medicine) Solon Augusta, MD as Attending Physician (Ophthalmology) [facility]  Indicate any recent Medical Services you may have received from other than [facility] providers in the past year (date may be approximate).     Assessment:   This is a routine wellness examination for Misty Gomez.  Hearing/Vision screen Hearing Screening - Comments:: Denies hearing difficulties   Vision Screening - Comments:: Wears rx glasses - up to date with routine eye exams with Dr. Katy Fitch     Dietary issues and exercise activities discussed: Current Exercise Habits: Home exercise routine, Type of exercise: walking, Time (Minutes): 30, Frequency (Times/Week): 5, Weekly Exercise (Minutes/Week): 150, Intensity: Mild   Goals Addressed   None    Depression Screen    07/05/2022    2:11 PM 04/14/2022   10:31 AM 01/22/2022   10:06 AM 04/01/2021    9:07 AM 02/09/2021    8:53 AM 03/18/2020    9:37 AM 03/13/2019    9:09 AM  PHQ 2/9 Scores  PHQ -  2 Score 0 0 0 0 1 0 0  PHQ- 9 Score  '2  1 3 '$ 0 0    Fall Risk    07/05/2022    2:08 PM 04/14/2022   10:31 AM 03/24/2022    2:05 PM 03/24/2022    1:58 PM 01/22/2022   10:06 AM  [place] in the past year? 0 0 0 0 0  Number falls in past yr: 0      Injury with Fall? 0      Follow up Falls prevention discussed;Education provided;Falls evaluation completed        FALL RISK PREVENTION PERTAINING TO THE HOME:  Any stairs in or around the home? No  If so, are there any without handrails? No  Home free of loose throw rugs in walkways, pet beds, electrical cords, etc? Yes  Adequate lighting in your home to reduce risk of falls? Yes   ASSISTIVE DEVICES UTILIZED TO PREVENT FALLS:  Life alert? No  Use of a cane, walker or w/c? No  Grab bars in the bathroom? Yes  Shower chair or bench in shower? No  Elevated toilet seat or a handicapped toilet? Yes   TIMED UP AND GO:  Was the test performed? No . Telephonic visit   Cognitive Function:    04/01/2021    9:08 AM  MMSE - Mini Mental State Exam  Orientation to time 5  Orientation to Place 5  Registration 3  Attention/ Calculation 5  Recall 3  Language- name 2 objects 2  Language- repeat 1  Language- follow 3 step command 3  Language- read & follow direction 1  Write a sentence 1  Copy design 1  Total score 30        07/05/2022    2:12 PM  6CIT Screen  What Year? 0 points  What month? 0 points  What time? 0 points  Count back from 20 0 points  Months in  reverse 0 points  Repeat phrase 0 points  Total Score 0 points    Immunizations Immunization History  Administered Date(s) Administered   Fluad Quad(high Dose 65+) 03/30/2021   Influenza-Unspecified 05/31/2018   MMR 02/23/2008, 03/22/2008   Moderna Sars-Covid-2 Vaccination 06/19/2019, 07/17/2019, 04/05/2020, 01/05/2021   Tdap 03/18/2020   Zoster Recombinat (Shingrix) 04/14/2022    TDAP status: Up to date  Flu Vaccine status: Up to date  Pneumococcal  vaccine status: Due, Education has been provided regarding the importance of this vaccine. Advised may receive this vaccine at local pharmacy or Health Dept. Aware to provide a copy of the vaccination record if obtained from local pharmacy or Health Dept. Verbalized acceptance and understanding.  Covid-19 vaccine status: Information provided on how to obtain vaccines.   Qualifies for Shingles Vaccine? Yes   Zostavax completed No   Shingrix Completed?: No.    Education has been provided regarding the importance of this vaccine. Patient has been advised to call insurance company to determine out of pocket expense if they have not yet received this vaccine. Advised may also receive vaccine at local pharmacy or Health Dept. Verbalized acceptance and understanding.  Screening Tests Health Maintenance  Topic Date Due   COVID-19 Vaccine (5 - 2023-24 season) 02/12/2022   Zoster Vaccines- Shingrix (2 of 2) 06/09/2022   INFLUENZA VACCINE  09/12/2022 (Originally 01/12/2022)   Pneumonia Vaccine 26+ Years old (1 - PCV) 04/15/2023 (Originally 07/01/2020)   Hepatitis C Screening  04/15/2023 (Originally 07/01/1973)   Medicare Annual Wellness (AWV)  07/06/2023   MAMMOGRAM  10/27/2023   COLONOSCOPY (Pts 45-9yr Insurance coverage will need to be confirmed)  01/07/2027   DTaP/Tdap/Td (2 - Td or Tdap) 03/18/2030   DEXA SCAN  Completed   HPV VACCINES  Aged Out    Health Maintenance  Health Maintenance Due  Topic Date Due   COVID-19 Vaccine (5 - 2023-24 season) 02/12/2022   Zoster Vaccines- Shingrix (2 of 2) 06/09/2022    Colorectal cancer screening: Type of screening: Colonoscopy. Completed 01/06/17. Repeat every 10 years  Mammogram status: Completed 10/26/21. Repeat every year  Bone Density status: Completed 05/14/21. Results reflect: Bone density results: OSTEOPENIA. Repeat every 2 years.  Lung Cancer Screening: (Low Dose CT Chest recommended if Age 67-80years, 30 pack-year currently smoking OR have  quit w/in 15years.) does not qualify.   Lung Cancer Screening Referral: n/a   Additional Screening:  Hepatitis C Screening: does qualify; Completed at next office visit   Vision Screening: Recommended annual ophthalmology exams for early detection of glaucoma and other disorders of the eye. Is the patient up to date with their annual eye exam?  No  Who is the provider or what is the name of the office in which the patient attends annual eye exams? Dr. GKaty Fitch If pt is not established with a provider, would they like to be referred to a provider to establish care? No .   Dental Screening: Recommended annual dental exams for proper oral hygiene  Community Resource Referral / Chronic Care Management: CRR required this visit?  No   CCM required this visit?  Yes      Plan:     I have personally reviewed and noted the following in the patient's chart:   Medical and social history Use of alcohol, tobacco or illicit drugs  Current medications and supplements including opioid prescriptions. Patient is not currently taking opioid prescriptions. Functional ability and status Nutritional status Physical activity Advanced directives List of other physicians  Hospitalizations, surgeries, and ER visits in previous 12 months Vitals Screenings to include cognitive, depression, and falls Referrals and appointments  In addition, I have reviewed and discussed with patient certain preventive protocols, quality metrics, and best practice recommendations. A written personalized care plan for preventive services as well as general preventive health recommendations were provided to patient.     Vanetta Mulders, [place]   6/39/4320   Due to this being a virtual visit, the after visit summary with patients personalized plan was offered to patient via mail or my-chart.  Patient would like to access on my-chart  Nurse Notes: No concerns

## 2022-07-05 ENCOUNTER — Ambulatory Visit (INDEPENDENT_AMBULATORY_CARE_PROVIDER_SITE_OTHER): Payer: Medicare PPO

## 2022-07-05 VITALS — Ht 66.0 in | Wt 177.0 lb

## 2022-07-05 DIAGNOSIS — Z0000 Encounter for general adult medical examination without abnormal findings: Secondary | ICD-10-CM | POA: Diagnosis not present

## 2022-07-15 ENCOUNTER — Ambulatory Visit: Payer: Medicare PPO

## 2022-07-15 ENCOUNTER — Ambulatory Visit (INDEPENDENT_AMBULATORY_CARE_PROVIDER_SITE_OTHER): Payer: Medicare PPO | Admitting: *Deleted

## 2022-07-15 DIAGNOSIS — Z23 Encounter for immunization: Secondary | ICD-10-CM

## 2022-07-15 NOTE — Progress Notes (Signed)
Shingrix vaccine (2nd) given left deltoid, intramuscular. Patient tolerated well

## 2022-08-20 DIAGNOSIS — Z96641 Presence of right artificial hip joint: Secondary | ICD-10-CM | POA: Diagnosis not present

## 2022-09-25 IMAGING — RF DG FLUORO GUIDE NDL PLC/BX
1 series · 3 of 3 positions shown · non-contrast
Comparison: Radiographs 12/09/2020

CLINICAL DATA: Chronic right hip pain for 6 months.

EXAM:
RIGHT HIP CONTRAST INJECTION UNDER FLUOROSCOPY, PRE MRI

[Series 1: cp_standard · 0.17mm/px · 3 of 6 frames shown]
[frame 1/6]
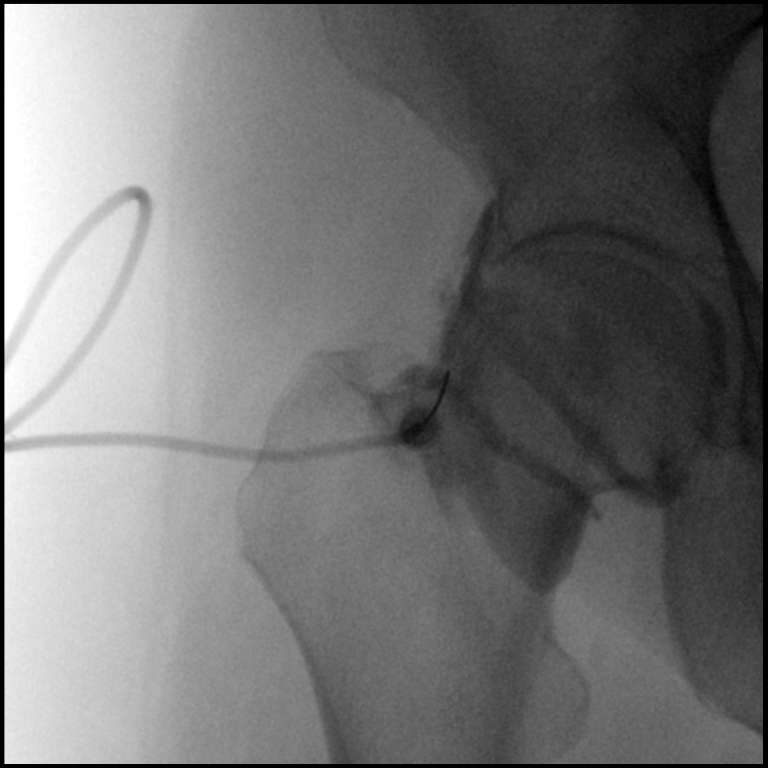
[frame 4/6]
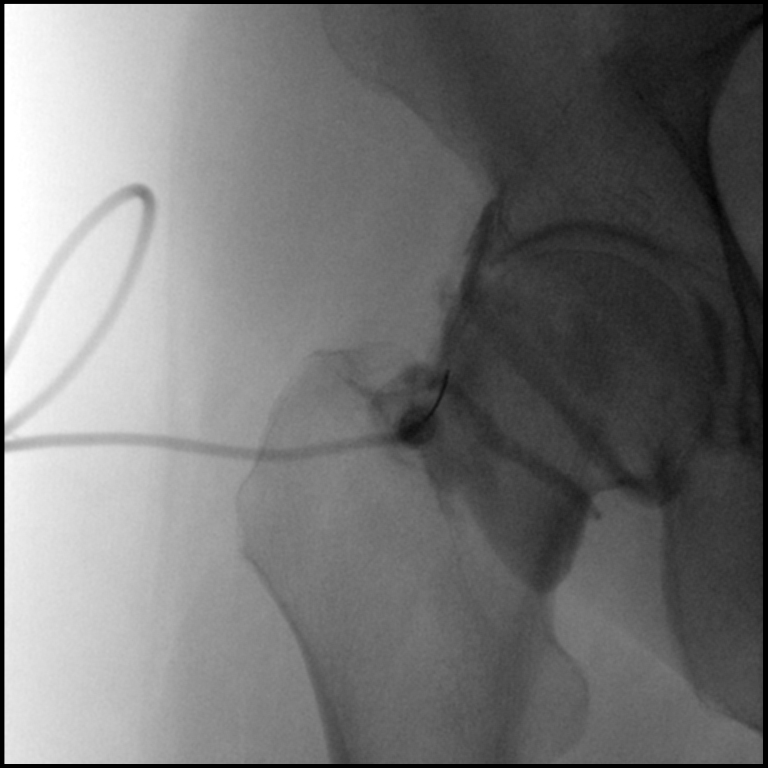
[frame 6/6]
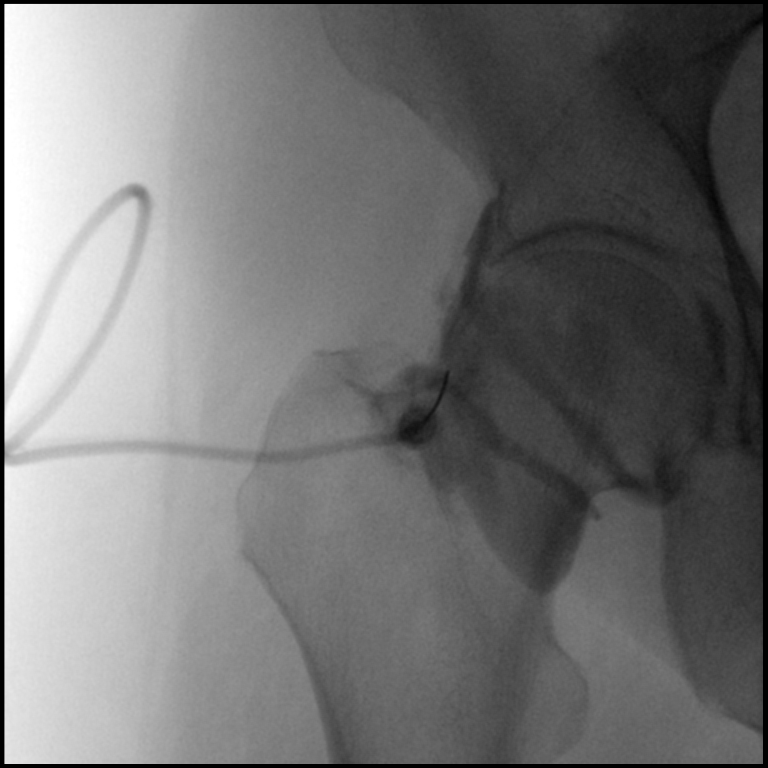

[3 of 3 positions shown; findings below may reference images not displayed]

FLUOROSCOPY TIME:  Fluoroscopy Time:  0 minutes, 42 seconds

Radiation Exposure Index (if provided by the fluoroscopic device):
5.3 mGy

Number of Acquired Spot Images: 0

PROCEDURE:
I discussed the risks (including hemorrhage, infection, and allergic
reaction, among others), benefits, and alternatives to the procedure
with the patient. We specifically discussed the high technical
likelihood of success of the procedure. The patient understood and
elected to undergo the procedure.

Standard time-out was employed. Following sterile skin prep and
local anesthetic administration consisting of 1% lidocaine, a 22
gauge needle was advanced without difficulty into the right hip
joint under fluoroscopic guidance. A total of 11 cc of a combination
of 10 cc Omnipaque 180, 10 cc of sterile saline, and 0.02 cc of
Gadavist was injected into the joint. The needle was subsequently
removed and the skin cleansed and bandaged. No immediate
complications were observed.
IMPRESSION: Technically successful right hip contrast injection under
fluoroscopy.

## 2022-09-25 IMAGING — MR MR HIP*R* W/CM
6 series · 40 of 40 positions shown · IV contrast (agent unspecified)
Comparison: X-ray hip 12/09/2020.

CLINICAL DATA: Right hip pain.

EXAM:
MRI OF THE RIGHT HIP WITH CONTRAST (MR Arthrogram)
TECHNIQUE: Multiplanar, multisequence MR imaging of the hip was performed
immediately following contrast injection into the hip joint under
fluoroscopic guidance. No intravenous contrast was administered.

[Series 12: T1 · coronal · right · 4.0mm · 1.92mm/px · 5 of 25 slices shown (1 of 2)]
[im 1/25]
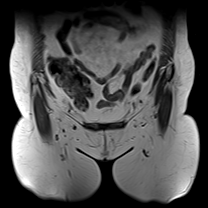
[im 7/25]
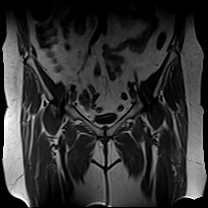
[im 13/25]
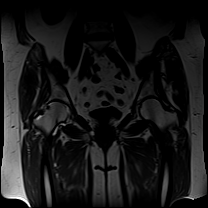
[im 19/25]
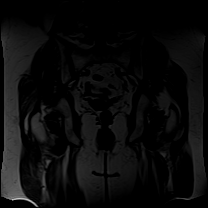
[im 25/25]
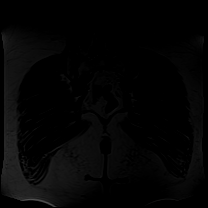

[Series 13: T2 fat-sat · coronal · right · 4.0mm · 0.89mm/px · 6 of 25 slices shown (1 of 2)]
[im 1/25]
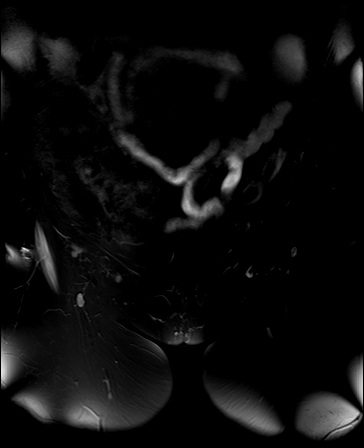
[im 5/25]
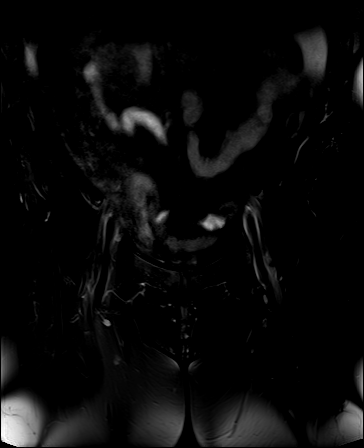
[im 10/25]
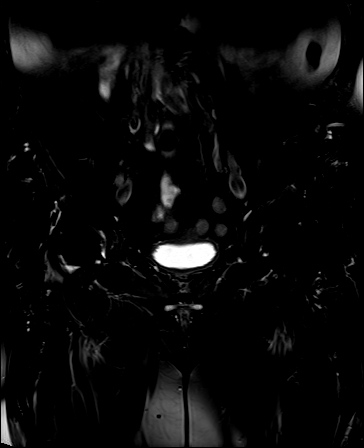
[im 15/25]
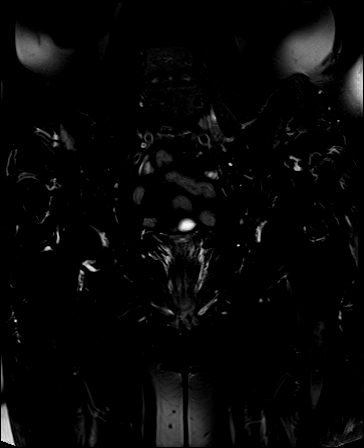
[im 20/25]
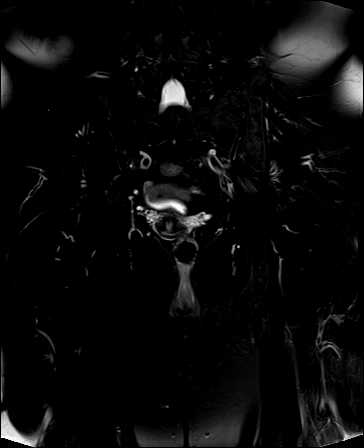
[im 25/25]
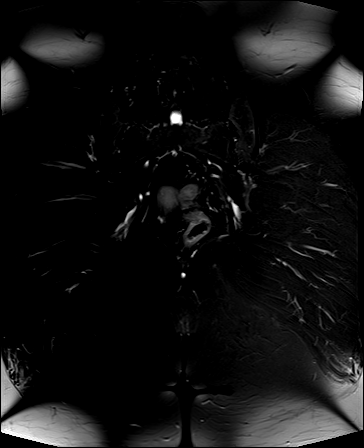

[Series 14: T2 fat-sat · axial · right · 4.0mm · 1.41mm/px · z∈[-29,+126]mm · 7 of 32 slices shown (2 of 2)]
[im 1/32]
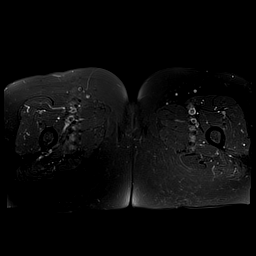
[im 6/32]
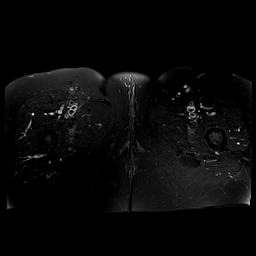
[im 11/32]
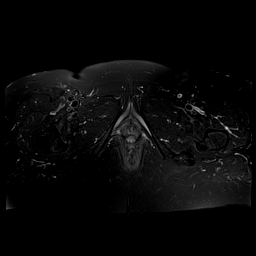
[im 16/32]
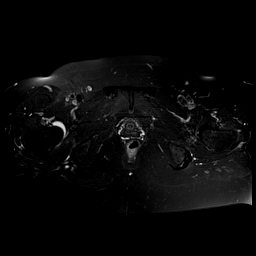
[im 21/32]
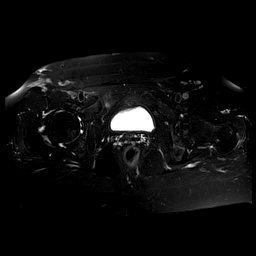
[im 26/32]
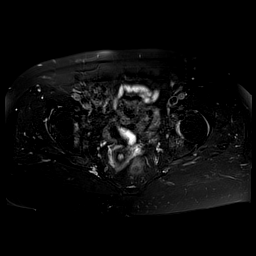
[im 32/32]
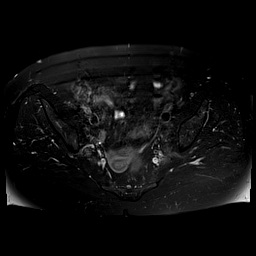

[Series 16: T1 · axial · right · 4.0mm · 0.70mm/px · z∈[-9,+121]mm · 7 of 30 slices shown (2 of 2)]
[im 1/30]
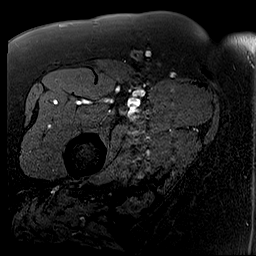
[im 5/30]
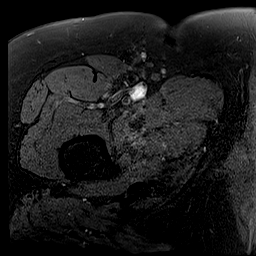
[im 10/30]
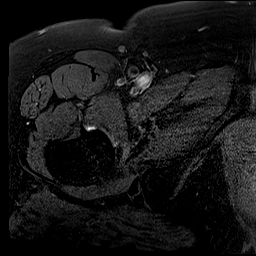
[im 15/30]
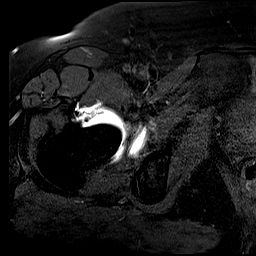
[im 20/30]
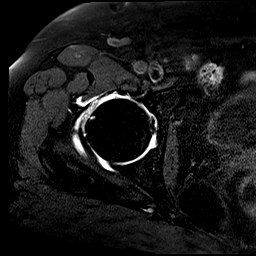
[im 25/30]
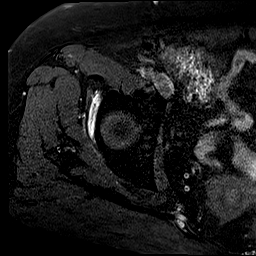
[im 30/30]
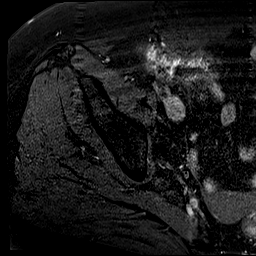

[Series 17: T1 fat-sat · sagittal · right · 4.0mm · 0.77mm/px · 6 of 25 slices shown (1 of 2)]
[im 1/25]
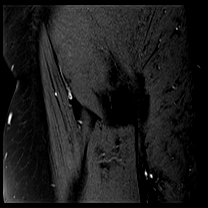
[im 5/25]
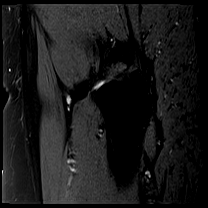
[im 10/25]
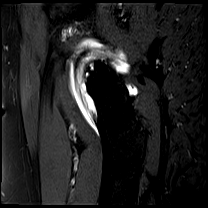
[im 15/25]
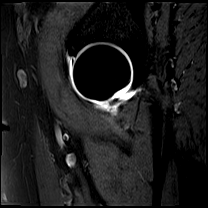
[im 20/25]
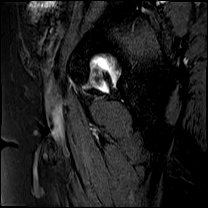
[im 25/25]
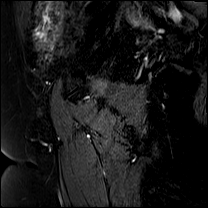

[Series 19: T1 fat-sat · coronal · right · 4.0mm · 0.47mm/px · 9 of 40 slices shown (2 of 2)]
[im 1/40]
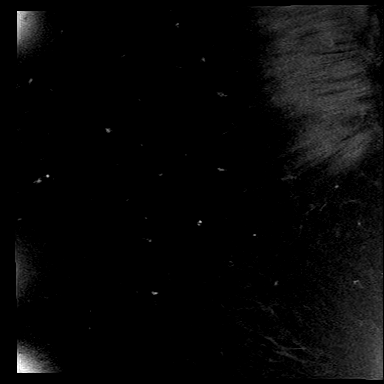
[im 5/40]
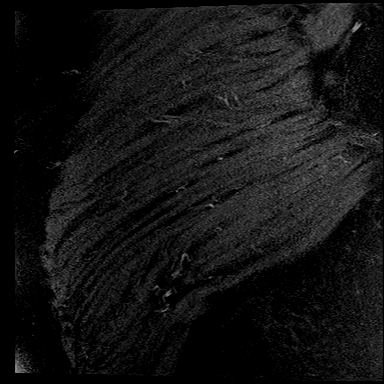
[im 10/40]
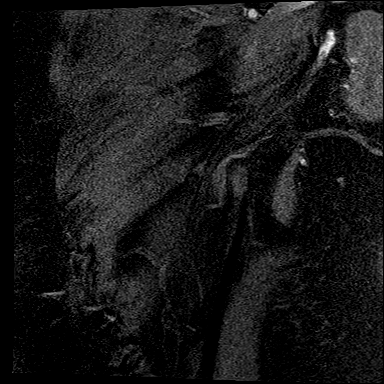
[im 15/40]
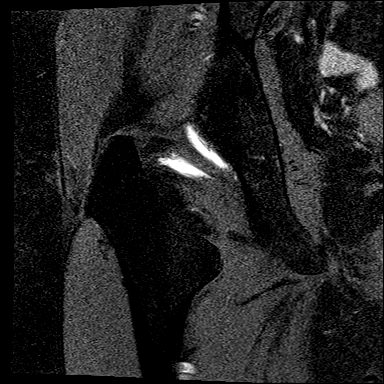
[im 20/40]
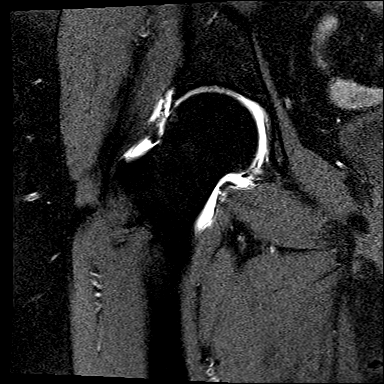
[im 25/40]
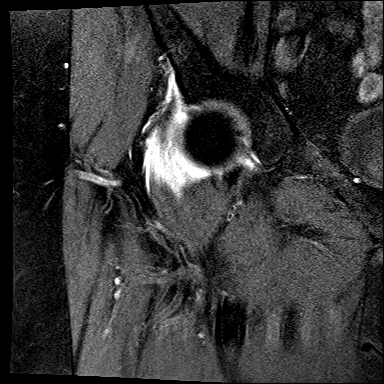
[im 30/40]
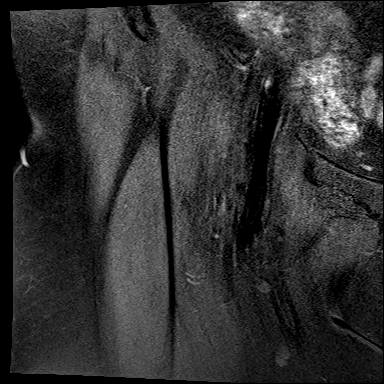
[im 35/40]
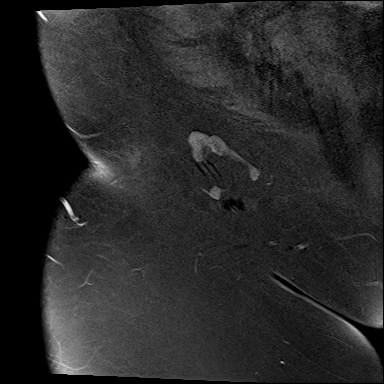
[im 40/40]
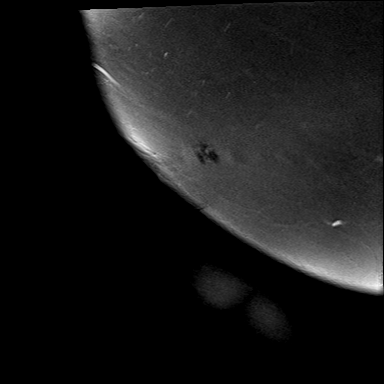

[40 of 40 positions shown; findings below may reference images not displayed]

FINDINGS: Bones: There is no evidence of acute fracture, dislocation or
avascular necrosis. No focal bone lesion. There is mild degenerative
arthritis of the sacroiliac joints. Pubic symphysis is unremarkable.
Lower lumbar spine degenerative disc disease, partially visualized.

Articular cartilage and labrum

Articular cartilage:  Mild chondrosis.

Labrum: There is an anterior superior labral tear at the
chondrolabral junction.

Joint or bursal effusion

Joint effusion: Adequate joint distension by arthrogram technique.

Bursae: No evidence of trochanteric bursitis.

Muscles and tendons

Muscles and tendons: The gluteal tendons are intact. The proximal
hamstrings are intact.The adductors are intact. No muscle atrophy of
edema.

Other findings

Miscellaneous: The visualized internal pelvic contents appear
unremarkable.
IMPRESSION: 1. Anterior superior labral tear at the chondrolabral junction.
2. Mild right hip osteoarthritis.

## 2022-10-29 DIAGNOSIS — Z1231 Encounter for screening mammogram for malignant neoplasm of breast: Secondary | ICD-10-CM | POA: Diagnosis not present

## 2022-10-29 LAB — HM MAMMOGRAPHY

## 2022-12-29 DIAGNOSIS — R350 Frequency of micturition: Secondary | ICD-10-CM | POA: Diagnosis not present

## 2022-12-29 DIAGNOSIS — H0100B Unspecified blepharitis left eye, upper and lower eyelids: Secondary | ICD-10-CM | POA: Diagnosis not present

## 2022-12-29 DIAGNOSIS — H43813 Vitreous degeneration, bilateral: Secondary | ICD-10-CM | POA: Diagnosis not present

## 2022-12-29 DIAGNOSIS — E079 Disorder of thyroid, unspecified: Secondary | ICD-10-CM | POA: Diagnosis not present

## 2022-12-29 DIAGNOSIS — H0100A Unspecified blepharitis right eye, upper and lower eyelids: Secondary | ICD-10-CM | POA: Diagnosis not present

## 2022-12-29 DIAGNOSIS — Z961 Presence of intraocular lens: Secondary | ICD-10-CM | POA: Diagnosis not present

## 2023-01-06 DIAGNOSIS — D225 Melanocytic nevi of trunk: Secondary | ICD-10-CM | POA: Diagnosis not present

## 2023-01-06 DIAGNOSIS — X32XXXA Exposure to sunlight, initial encounter: Secondary | ICD-10-CM | POA: Diagnosis not present

## 2023-01-06 DIAGNOSIS — Z1283 Encounter for screening for malignant neoplasm of skin: Secondary | ICD-10-CM | POA: Diagnosis not present

## 2023-01-06 DIAGNOSIS — L570 Actinic keratosis: Secondary | ICD-10-CM | POA: Diagnosis not present

## 2023-01-06 DIAGNOSIS — L430 Hypertrophic lichen planus: Secondary | ICD-10-CM | POA: Diagnosis not present

## 2023-01-06 DIAGNOSIS — L820 Inflamed seborrheic keratosis: Secondary | ICD-10-CM | POA: Diagnosis not present

## 2023-04-13 ENCOUNTER — Encounter: Payer: Self-pay | Admitting: Family Medicine

## 2023-05-18 DIAGNOSIS — H532 Diplopia: Secondary | ICD-10-CM | POA: Diagnosis not present

## 2023-05-18 DIAGNOSIS — E0500 Thyrotoxicosis with diffuse goiter without thyrotoxic crisis or storm: Secondary | ICD-10-CM | POA: Diagnosis not present

## 2023-05-18 DIAGNOSIS — H0279 Other degenerative disorders of eyelid and periocular area: Secondary | ICD-10-CM | POA: Diagnosis not present

## 2023-05-18 DIAGNOSIS — Z09 Encounter for follow-up examination after completed treatment for conditions other than malignant neoplasm: Secondary | ICD-10-CM | POA: Diagnosis not present

## 2023-06-02 ENCOUNTER — Other Ambulatory Visit: Payer: Self-pay | Admitting: Family Medicine

## 2023-06-02 ENCOUNTER — Encounter: Payer: Self-pay | Admitting: Family Medicine

## 2023-06-02 DIAGNOSIS — R053 Chronic cough: Secondary | ICD-10-CM

## 2023-06-02 DIAGNOSIS — Z8709 Personal history of other diseases of the respiratory system: Secondary | ICD-10-CM

## 2023-06-02 NOTE — Telephone Encounter (Signed)
[place] NTBS last OV 05/17/22 NO RF sent to pharmacy since last OV greater than a year

## 2023-06-02 NOTE — Telephone Encounter (Signed)
 Called to schedule appt no answer no voicemail Letter mailed

## 2023-06-08 ENCOUNTER — Other Ambulatory Visit: Payer: Self-pay | Admitting: Family Medicine

## 2023-06-22 DIAGNOSIS — H0102B Squamous blepharitis left eye, upper and lower eyelids: Secondary | ICD-10-CM | POA: Diagnosis not present

## 2023-06-22 DIAGNOSIS — Z961 Presence of intraocular lens: Secondary | ICD-10-CM | POA: Diagnosis not present

## 2023-06-22 DIAGNOSIS — H0102A Squamous blepharitis right eye, upper and lower eyelids: Secondary | ICD-10-CM | POA: Diagnosis not present

## 2023-06-22 DIAGNOSIS — H43813 Vitreous degeneration, bilateral: Secondary | ICD-10-CM | POA: Diagnosis not present

## 2023-06-22 DIAGNOSIS — H35372 Puckering of macula, left eye: Secondary | ICD-10-CM | POA: Diagnosis not present

## 2023-06-27 ENCOUNTER — Encounter: Payer: Self-pay | Admitting: Family Medicine

## 2023-07-06 ENCOUNTER — Encounter: Payer: Self-pay | Admitting: Family Medicine

## 2023-07-06 ENCOUNTER — Ambulatory Visit (INDEPENDENT_AMBULATORY_CARE_PROVIDER_SITE_OTHER): Payer: Medicare PPO | Admitting: Family Medicine

## 2023-07-06 VITALS — BP 112/70 | HR 60 | Temp 98.7°F | Ht 66.0 in | Wt 174.0 lb

## 2023-07-06 DIAGNOSIS — Z8709 Personal history of other diseases of the respiratory system: Secondary | ICD-10-CM | POA: Diagnosis not present

## 2023-07-06 DIAGNOSIS — M8588 Other specified disorders of bone density and structure, other site: Secondary | ICD-10-CM

## 2023-07-06 DIAGNOSIS — Z1159 Encounter for screening for other viral diseases: Secondary | ICD-10-CM

## 2023-07-06 DIAGNOSIS — Z0001 Encounter for general adult medical examination with abnormal findings: Secondary | ICD-10-CM | POA: Diagnosis not present

## 2023-07-06 DIAGNOSIS — E890 Postprocedural hypothyroidism: Secondary | ICD-10-CM | POA: Diagnosis not present

## 2023-07-06 DIAGNOSIS — E782 Mixed hyperlipidemia: Secondary | ICD-10-CM

## 2023-07-06 DIAGNOSIS — Z780 Asymptomatic menopausal state: Secondary | ICD-10-CM

## 2023-07-06 DIAGNOSIS — Z0000 Encounter for general adult medical examination without abnormal findings: Secondary | ICD-10-CM

## 2023-07-06 DIAGNOSIS — E559 Vitamin D deficiency, unspecified: Secondary | ICD-10-CM | POA: Diagnosis not present

## 2023-07-06 LAB — LIPID PANEL

## 2023-07-06 MED ORDER — LEVOTHYROXINE SODIUM 125 MCG PO TABS: 125.0000 ug | ORAL_TABLET | Freq: Every day | ORAL | 3 refills | Status: DC

## 2023-07-06 NOTE — Patient Instructions (Signed)
Basic Skin Care Your skin plays an important role in keeping the entire body healthy.  Below are some tips on how to try and maximize skin health from the outside in.  Bathe in mildly warm water every 1 to 3 days, followed by light drying and an application of a thick moisturizer cream or ointment, preferably one that comes in a tub. Fragrance free moisturizing bars or body washes are preferred such as Purpose, Cetaphil, Dove sensitive skin, Aveeno, [place] Baby or Vanicream products. Use a fragrance free cream or ointment, not a lotion, such as plain petroleum jelly or Vaseline ointment, Aquaphor, Vanicream, Eucerin cream or a generic version, CeraVe Cream, Cetaphil Restoraderm, Aveeno Eczema Therapy and [place] Baby Calming, among others. Children with very dry skin often need to put on these creams two, three or four times a day.  As much as possible, use these creams enough to keep the skin from looking dry. Consider using fragrance free/dye free detergent, such as Arm and Hammer for sensitive skin, Tide Free or All Free.   If I am prescribing a medication to go on the skin, the medicine goes on first to the areas that need it, followed by a thick cream as above to the entire body.  Sun is a major cause of damage to the skin. I recommend sun protection for all of my patients. I prefer physical barriers such as hats with wide brims that cover the ears, long sleeve clothing with SPF protection including rash guards for swimming. These can be found seasonally at outdoor clothing companies, [employer] and [employer] and online at www.coolibar.com, www.uvskinz.com and www.sunprecautions.com. Avoid peak sun between the hours of 10am to 3pm to minimize sun exposure.  I recommend sunscreen for all of my patients older than 6 months of age when in the sun, preferably with broad spectrum coverage and SPF 30 or higher.  For children, I recommend sunscreens that only contain titanium dioxide and/or zinc oxide  in the active ingredients. These do not burn the eyes and appear to be safer than chemical sunscreens. These sunscreens include zinc oxide paste found in the diaper section, Vanicream Broad Spectrum 50+, Aveeno Natural Mineral Protection, Neutrogena Pure and Free Baby, Johnson and Johnson Baby Daily face and body lotion, [place] Baby products, among others. There is no such thing as waterproof sunscreen. All sunscreens should be reapplied after 60-80 minutes of wear.  Spray on sunscreens often use chemical sunscreens which do protect against the sun. However, these can be difficult to apply correctly, especially if wind is present, and can be more likely to irritate the skin.  Long term effects of chemical sunscreens are also not fully known.     

## 2023-07-06 NOTE — Progress Notes (Signed)
Misty Gomez is a 68 y.o. female presents to office today for annual physical exam examination.    Concerns today include: 1. Dry patch on face She reports a small dry patch on her right cheek.  Just wanted to make sure this was nothing to be concerned about.  She is been utilizing vitamin E oil.  Occupation: Retired but has been doing some work in [employer] to help with [employer] recovery, Marital status: Married, Substance use: None Health Maintenance Due  Topic Date Due   Pneumonia Vaccine 61+ Years old (1 of 1 - PCV) Never done   COVID-19 Vaccine (5 - 2024-25 season) 02/13/2023   DEXA SCAN  05/15/2023   Medicare Annual Wellness (AWV)  07/06/2023   Refills needed today: Synthroid  Immunization History  Administered Date(s) Administered   Fluad Quad(high Dose 65+) 03/30/2021, 04/12/2023   Influenza, Seasonal, Injecte, Preservative Fre 03/23/2018   Influenza-Unspecified 05/31/2018   MMR 02/23/2008, 03/22/2008   Moderna Sars-Covid-2 Vaccination 06/19/2019, 07/17/2019, 04/05/2020, 01/05/2021   Tdap 03/18/2020   Zoster Recombinant(Shingrix) 04/14/2022, 07/15/2022   Past Medical History:  Diagnosis Date   Anemia    Arthritis    Asthma    GERD (gastroesophageal reflux disease)    Hypothyroidism    Thyroid eye disease 2017   Vitamin D deficiency    Social History   Socioeconomic History   Marital status: Married    Spouse name: Not on file   Number of children: 0   Years of education: Not on file   Highest education level: Not on file  Occupational History   Not on file  Tobacco Use   Smoking status: Never    Passive exposure: Never   Smokeless tobacco: Never  Vaping Use   Vaping status: Never Used  Substance and Sexual Activity   Alcohol use: No   Drug use: No   Sexual activity: Not Currently  Other Topics Concern   Not on file  Social History Narrative   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  She lives at home  with her husband.   She walks several times weekly.   Social Drivers of Corporate investment banker Strain: Low Risk  (07/06/2023)   Overall Financial Resource Strain (CARDIA)    Difficulty of Paying Living Expenses: Not hard at all  Food Insecurity: No Food Insecurity (07/06/2023)   Hunger Vital Sign    Worried About Running Out of Food in the Last Year: Never true    Ran Out of Food in the Last Year: Never true  Transportation Needs: No Transportation Needs (07/06/2023)   PRAPARE - Administrator, Civil Service (Medical): No    Lack of Transportation (Non-Medical): No  Physical Activity: Sufficiently Active (07/06/2023)   Exercise Vital Sign    Days of Exercise per Week: 7 days    Minutes of Exercise per Session: 60 min  Stress: No Stress Concern Present (07/06/2023)   [employer] of Occupational Health - Occupational Stress Questionnaire    Feeling of Stress : Only a little  Social Connections: Socially Integrated (07/06/2023)   Social Connection and Isolation Panel [NHANES]    Frequency of Communication with Friends and Family: More than three times a week    Frequency of Social Gatherings with Friends and Family: Three times a week    Attends Religious Services: 1 to 4 times per year    Active Member of Clubs or Organizations: Yes  Attends Banker Meetings: More than 4 times per year    Marital Status: Married  Catering manager Violence: Not At Risk (07/06/2023)   Humiliation, Afraid, Rape, and Kick questionnaire    Fear of Current or Ex-Partner: No    Emotionally Abused: No    Physically Abused: No    Sexually Abused: No   Past Surgical History:  Procedure Laterality Date   BARTHOLIN GLAND CYST EXCISION     COLONOSCOPY N/A 01/06/2017   Procedure: COLONOSCOPY;  Surgeon: Malissa Hippo, MD;  [facility];  Service: Endoscopy;  Laterality: N/A;  930   POLYPECTOMY  01/06/2017   Procedure: POLYPECTOMY;  Surgeon: Malissa Hippo, MD;   [facility];  Service: Endoscopy;;  colon   Family History  Problem Relation Age of Onset   CVA Mother 23   Rheum arthritis Mother    Heart attack Mother 45   Heart disease Mother    Heart attack Father 37       had first heart attack in early 62s   Heart disease Father    Cancer Brother    Heart disease Brother    Cancer Brother    Diabetes Brother    Hypertension Brother    Pancreatic disease Maternal Grandmother    Diabetes Maternal Grandmother    Asthma Maternal Grandfather    Heart attack Maternal Grandfather    Heart attack Paternal Grandmother    Heart attack Paternal Grandfather     Current Outpatient Medications:    acetaminophen (TYLENOL) 500 MG tablet, , Disp: , Rfl:    albuterol (VENTOLIN HFA) 108 (90 Base) MCG/ACT inhaler, Inhale 1-2 puffs into the lungs every 4 (four) hours as needed for wheezing or shortness of breath., Disp: 1 each, Rfl: 0   cetirizine (ZYRTEC) 10 MG tablet, Take 1 tablet (10 mg total) by mouth daily., Disp: 30 tablet, Rfl: 11   montelukast (SINGULAIR) 10 MG tablet, Take 1 tablet (10 mg total) by mouth at bedtime., Disp: 90 tablet, Rfl: 3   omeprazole (PRILOSEC) 20 MG capsule, Take 20 mg by mouth daily., Disp: , Rfl:    levothyroxine (SYNTHROID) 125 MCG tablet, Take 1 tablet (125 mcg total) by mouth daily., Disp: 90 tablet, Rfl: 3  Allergies  Allergen Reactions   Relafen [Nabumetone] Rash    All over     ROS: Review of Systems Pertinent items noted in HPI and remainder of comprehensive ROS otherwise negative.   Pos: hearing aids, urge incontinence  Physical exam BP 112/70   Pulse 60   Temp 98.7 F (37.1 C)   Ht 5\' 6"  (1.676 m)   Wt 174 lb (78.9 kg)   SpO2 96%   BMI 28.08 kg/m  General appearance: alert, cooperative, appears stated age, and no distress Head: Normocephalic, without obvious abnormality, atraumatic Eyes: negative findings: lids and lashes normal, conjunctivae and sclerae normal, corneas clear, and pupils  equal, round, reactive to light and accomodation Ears: normal TM's and external ear canals both ears Nose: Nares normal. Septum midline. Mucosa normal. No drainage or sinus tenderness. Throat: lips, mucosa, and tongue normal; teeth and gums normal Neck: no adenopathy, supple, symmetrical, trachea midline, and thyroid not enlarged, symmetric, no tenderness/mass/nodules Back: symmetric, no curvature. ROM normal. No CVA tenderness. Lungs: clear to auscultation bilaterally Heart: regular rate and rhythm, S1, S2 normal, no murmur, click, rub or gallop Abdomen: soft, non-tender; bowel sounds normal; no masses,  no organomegaly Extremities: extremities normal, atraumatic, no cyanosis or  edema Pulses: 2+ and symmetric Skin:  Small circular dry patch on the right cheek.  No appreciable pigmentation. Lymph nodes: Cervical, supraclavicular, and axillary nodes normal. Neurologic: Grossly normal except hard of hearing.  Requires hearing aids     07/06/2023    8:31 AM 07/05/2022    2:11 PM 04/14/2022   10:31 AM  Depression screen PHQ 2/9  Decreased Interest 0 0 0  Down, Depressed, Hopeless 0 0 0  PHQ - 2 Score 0 0 0  Altered sleeping 0  1  Tired, decreased energy 0  1  Change in appetite 0  0  Feeling bad or failure about yourself  0  0  Trouble concentrating 0  0  Moving slowly or fidgety/restless 0  0  Suicidal thoughts 0  0  PHQ-9 Score 0  2  Difficult doing work/chores Not difficult at all  Somewhat difficult      07/06/2023    8:31 AM 04/14/2022   10:31 AM 04/01/2021    9:07 AM 02/09/2021    8:53 AM  GAD 7 : Generalized Anxiety Score  Nervous, Anxious, on Edge 0 0 0 0  Control/stop worrying 0 0 0 0  Worry too much - different things 0 1 0 0  Trouble relaxing 0 1 0 0  Restless 0 1 0 0  Easily annoyed or irritable 1 0 1 1  Afraid - awful might happen 0 0 0 0  Total GAD 7 Score 1 3 1 1   Anxiety Difficulty Not difficult at all Somewhat difficult Not difficult at all Not difficult at all      Assessment/ Plan: Lanier Clam here for annual physical exam.   Physical exam, annual  Vitamin D deficiency - Plan: VITAMIN D 25 Hydroxy (Vit-D Deficiency, Fractures)  Postablative hypothyroidism - Plan: levothyroxine (SYNTHROID) 125 MCG tablet, TSH + free T4  Mixed hyperlipidemia - Plan: Comprehensive metabolic panel, Lipid panel  Osteopenia of lumbar spine - Plan: CBC with Differential/Platelet, DG [facility] DEXA, CANCELED: DG [facility] DEXA  Need for hepatitis C screening test - Plan: VITAMIN D 25 Hydroxy (Vit-D Deficiency, Fractures)  History of asthma - Plan: CBC with Differential/Platelet  Check fasting labs.  I have renewed her Synthroid.  She is clinically euthyroid.  Continues to stay physically active and tries to have a balanced diet.  Asthma has been well-controlled with Singulair and as needed use of albuterol.  She did not need refills on these today.  Discussed DEXA scan.  Future order placed.  She declines this and hepatitis C screening today.  Declines pneumonia shot.  Had flu shot  Counseled on healthy lifestyle choices, including diet (rich in fruits, vegetables and lean meats and low in salt and simple carbohydrates) and exercise (at least 30 minutes of moderate physical activity daily).  Patient to follow up 6-12 months  Inanna Telford M. Nadine Counts, DO

## 2023-07-07 LAB — CBC WITH DIFFERENTIAL/PLATELET
Basophils Absolute: 0 10*3/uL (ref 0.0–0.2)
Basos: 1 %
EOS (ABSOLUTE): 0.2 10*3/uL (ref 0.0–0.4)
Eos: 4 %
Hematocrit: 39 % (ref 34.0–46.6)
Hemoglobin: 12.4 g/dL (ref 11.1–15.9)
Immature Grans (Abs): 0 10*3/uL (ref 0.0–0.1)
Immature Granulocytes: 0 %
Lymphocytes Absolute: 1.6 10*3/uL (ref 0.7–3.1)
Lymphs: 33 %
MCH: 26.6 pg (ref 26.6–33.0)
MCHC: 31.8 g/dL (ref 31.5–35.7)
MCV: 84 fL (ref 79–97)
Monocytes Absolute: 0.4 10*3/uL (ref 0.1–0.9)
Monocytes: 9 %
Neutrophils Absolute: 2.5 10*3/uL (ref 1.4–7.0)
Neutrophils: 53 %
Platelets: 242 10*3/uL (ref 150–450)
RBC: 4.67 x10E6/uL (ref 3.77–5.28)
RDW: 13.6 % (ref 11.7–15.4)
WBC: 4.8 10*3/uL (ref 3.4–10.8)

## 2023-07-07 LAB — COMPREHENSIVE METABOLIC PANEL
ALT: 11 IU/L (ref 0–32)
AST: 25 IU/L (ref 0–40)
Albumin: 4 g/dL (ref 3.9–4.9)
Alkaline Phosphatase: 110 IU/L (ref 44–121)
BUN/Creatinine Ratio: 23 (ref 12–28)
BUN: 16 mg/dL (ref 8–27)
Bilirubin Total: 0.6 mg/dL (ref 0.0–1.2)
CO2: 23 mmol/L (ref 20–29)
Calcium: 9.2 mg/dL (ref 8.7–10.3)
Chloride: 103 mmol/L (ref 96–106)
Creatinine, Ser: 0.71 mg/dL (ref 0.57–1.00)
Globulin, Total: 2.3 g/dL (ref 1.5–4.5)
Glucose: 83 mg/dL (ref 70–99)
Potassium: 4.3 mmol/L (ref 3.5–5.2)
Sodium: 141 mmol/L (ref 134–144)
Total Protein: 6.3 g/dL (ref 6.0–8.5)
eGFR: 93 mL/min/{1.73_m2} (ref >59)

## 2023-07-07 LAB — TSH+FREE T4
Free T4: 1.37 ng/dL (ref 0.82–1.77)
TSH: 0.657 u[IU]/mL (ref 0.450–4.500)

## 2023-07-07 LAB — LIPID PANEL
Cholesterol, Total: 233 mg/dL — ABNORMAL HIGH (ref 100–199)
HDL: 67 mg/dL (ref >39)
LDL CALC COMMENT:: 3.5 ratio (ref 0.0–4.4)
LDL Chol Calc (NIH): 155 mg/dL — ABNORMAL HIGH (ref 0–99)
Triglycerides: 65 mg/dL (ref 0–149)
VLDL Cholesterol Cal: 11 mg/dL (ref 5–40)

## 2023-07-07 LAB — VITAMIN D 25 HYDROXY (VIT D DEFICIENCY, FRACTURES): Vit D, 25-Hydroxy: 32.6 ng/mL (ref 30.0–100.0)

## 2023-07-08 ENCOUNTER — Encounter: Payer: Self-pay | Admitting: Family Medicine

## 2023-07-11 ENCOUNTER — Ambulatory Visit: Payer: Medicare PPO

## 2023-07-11 VITALS — Ht 66.0 in | Wt 174.0 lb

## 2023-07-11 DIAGNOSIS — Z0000 Encounter for general adult medical examination without abnormal findings: Secondary | ICD-10-CM

## 2023-07-11 NOTE — Progress Notes (Signed)
Subjective:   Misty Gomez is a 68 y.o. female who presents for Medicare Annual (Subsequent) preventive examination.  Visit Complete: Virtual I connected with  Misty Gomez on 07/11/23 by a audio enabled telemedicine application and verified that I am speaking with the correct person using two identifiers.  Patient Location: Home  Provider Location: Home Office  This patient declined Interactive audio and video telecommunications. Therefore the visit was completed with audio only.  I discussed the limitations of evaluation and management by telemedicine. The patient expressed understanding and agreed to proceed.  Vital Signs: Because this visit was a virtual/telehealth visit, some criteria may be missing or patient reported. Any vitals not documented were not able to be obtained and vitals that have been documented are patient reported.  Cardiac Risk Factors include: advanced age (>81men, >73 women);dyslipidemia     Objective:    Today's Vitals   07/11/23 1647  Weight: 174 lb (78.9 kg)  Height: 5\' 6"  (1.676 m)   Body mass index is 28.08 kg/m.     07/11/2023    4:50 PM 07/05/2022    2:12 PM 04/07/2022   10:26 AM 04/01/2021   10:06 AM 03/04/2021   10:10 AM 01/06/2017    8:41 AM  Advanced Directives  Does Patient Have a Medical Advance Directive? Yes Yes Yes Yes Yes Yes  Type of Estate agent of [place];Living will Living will;Healthcare Power of Attorney  Out of facility DNR (pink MOST or yellow form);Living will    Does patient want to make changes to medical advance directive? No - Patient declined No - Patient declined      Copy of Healthcare Power of Attorney in Chart? Yes - validated most recent copy scanned in chart (See row information) Yes - validated most recent copy scanned in chart (See row information)        Current Medications (verified) Outpatient Encounter Medications as of 07/11/2023  Medication Sig   acetaminophen (TYLENOL) 500  MG tablet    albuterol (VENTOLIN HFA) 108 (90 Base) MCG/ACT inhaler Inhale 1-2 puffs into the lungs every 4 (four) hours as needed for wheezing or shortness of breath.   cetirizine (ZYRTEC) 10 MG tablet Take 1 tablet (10 mg total) by mouth daily.   levothyroxine (SYNTHROID) 125 MCG tablet Take 1 tablet (125 mcg total) by mouth daily.   montelukast (SINGULAIR) 10 MG tablet Take 1 tablet (10 mg total) by mouth at bedtime.   omeprazole (PRILOSEC) 20 MG capsule Take 20 mg by mouth daily.   No facility-administered encounter medications on file as of 07/11/2023.    Allergies (verified) Relafen [nabumetone]   History: Past Medical History:  Diagnosis Date   Anemia    Arthritis    Asthma    GERD (gastroesophageal reflux disease)    Hypothyroidism    Thyroid eye disease 2017   Vitamin D deficiency    Past Surgical History:  Procedure Laterality Date   BARTHOLIN GLAND CYST EXCISION     COLONOSCOPY N/A 01/06/2017   Procedure: COLONOSCOPY;  Surgeon: Malissa Hippo, MD;  [facility];  Service: Endoscopy;  Laterality: N/A;  930   POLYPECTOMY  01/06/2017   Procedure: POLYPECTOMY;  Surgeon: Malissa Hippo, MD;  [facility];  Service: Endoscopy;;  colon   Family History  Problem Relation Age of Onset   CVA Mother 89   Rheum arthritis Mother    Heart attack Mother 63   Heart disease Mother  Heart attack Father 22       had first heart attack in early 66s   Heart disease Father    Cancer Brother    Heart disease Brother    Cancer Brother    Diabetes Brother    Hypertension Brother    Pancreatic disease Maternal Grandmother    Diabetes Maternal Grandmother    Asthma Maternal Grandfather    Heart attack Maternal Grandfather    Heart attack Paternal Grandmother    Heart attack Paternal Grandfather    Social History   Socioeconomic History   Marital status: Married    Spouse name: Not on file   Number of children: 0   Years of education: Not on file    Highest education level: Not on file  Occupational History   Not on file  Tobacco Use   Smoking status: Never    Passive exposure: Never   Smokeless tobacco: Never  Vaping Use   Vaping status: Never Used  Substance and Sexual Activity   Alcohol use: No   Drug use: No   Sexual activity: Not Currently  Other Topics Concern   Not on file  Social History Narrative   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   She walks several times weekly.   Social Drivers of Corporate investment banker Strain: Low Risk  (07/11/2023)   Overall Financial Resource Strain (CARDIA)    Difficulty of Paying Living Expenses: Not hard at all  Food Insecurity: No Food Insecurity (07/11/2023)   Hunger Vital Sign    Worried About Running Out of Food in the Last Year: Never true    Ran Out of Food in the Last Year: Never true  Transportation Needs: No Transportation Needs (07/11/2023)   PRAPARE - Administrator, Civil Service (Medical): No    Lack of Transportation (Non-Medical): No  Physical Activity: Sufficiently Active (07/11/2023)   Exercise Vital Sign    Days of Exercise per Week: 7 days    Minutes of Exercise per Session: 60 min  Stress: No Stress Concern Present (07/11/2023)   [employer] of Occupational Health - Occupational Stress Questionnaire    Feeling of Stress : Only a little  Social Connections: Socially Integrated (07/11/2023)   Social Connection and Isolation Panel [NHANES]    Frequency of Communication with Friends and Family: More than three times a week    Frequency of Social Gatherings with Friends and Family: Three times a week    Attends Religious Services: 1 to 4 times per year    Active Member of Clubs or Organizations: Yes    Attends Engineer, structural: More than 4 times per year    Marital Status: Married    Tobacco Counseling Counseling given: Not Answered   Clinical  Intake:  Pre-visit preparation completed: Yes  Pain : No/denies pain     Diabetes: No  How often do you need to have someone help you when you read instructions, pamphlets, or other written materials from your doctor or pharmacy?: 1 - Never  Interpreter Needed?: No  Information entered by :: Misty Fantasia LPN   Activities of Daily Living    07/11/2023    4:49 PM  In your present state of health, do you have any difficulty performing the following activities:  Hearing? 0  Vision? 0  Difficulty concentrating or making decisions? 0  Walking or climbing stairs?  0  Dressing or bathing? 0  Doing errands, shopping? 0  Preparing Food and eating ? N  Using the Toilet? N  In the past six months, have you accidently leaked urine? N  Do you have problems with loss of bowel control? N  Managing your Medications? N  Managing your Finances? N  Housekeeping or managing your Housekeeping? N    Patient Care Team: Raliegh Ip, DO as PCP - General (Family Medicine) Etta Quill, MD as Attending Physician (Ophthalmology) [facility]  Indicate any recent Medical Services you may have received from other than [facility] providers in the past year (date may be approximate).     Assessment:   This is a routine wellness examination for Marisal.  Hearing/Vision screen Hearing Screening - Comments:: Denies hearing difficulties   Vision Screening - Comments:: Wears rx glasses - up to date with routine eye exams with [facility]     Goals Addressed             This Visit's Progress    Remain active and independent        Depression Screen    07/11/2023    4:50 PM 07/06/2023    8:31 AM 07/05/2022    2:11 PM 04/14/2022   10:31 AM 01/22/2022   10:06 AM 04/01/2021    9:07 AM 02/09/2021    8:53 AM  PHQ 2/9 Scores  PHQ - 2 Score 0 0 0 0 0 0 1  PHQ- 9 Score 0 0  2  1 3     Fall Risk    07/11/2023    4:49 PM 07/06/2023    8:31 AM 07/05/2022    2:08 PM  04/14/2022   10:31 AM 03/24/2022    2:05 PM  Fall Risk   Falls in the past year? 0 0 0 0 0  Number falls in past yr: 0 0 0    Injury with Fall? 0 0 0    Risk for fall due to : No Fall Risks No Fall Risks     Follow up Falls prevention discussed;Education provided;Falls evaluation completed Falls evaluation completed Falls prevention discussed;Education provided;Falls evaluation completed      MEDICARE RISK AT HOME: Medicare Risk at Home Any stairs in or around the home?: Yes If so, are there any without handrails?: No Home free of loose throw rugs in walkways, pet beds, electrical cords, etc?: Yes Adequate lighting in your home to reduce risk of falls?: Yes Life alert?: No Use of a cane, walker or w/c?: No Grab bars in the bathroom?: Yes Shower chair or bench in shower?: No Elevated toilet seat or a handicapped toilet?: Yes  TIMED UP AND GO:  Was the test performed?  No    Cognitive Function:    04/01/2021    9:08 AM  MMSE - Mini Mental State Exam  Orientation to time 5  Orientation to Place 5  Registration 3  Attention/ Calculation 5  Recall 3  Language- name 2 objects 2  Language- repeat 1  Language- follow 3 step command 3  Language- read & follow direction 1  Write a sentence 1  Copy design 1  Total score 30        07/11/2023    4:49 PM 07/05/2022    2:12 PM  6CIT Screen  What Year? 0 points 0 points  What month? 0 points 0 points  What time? 0 points 0 points  Count back from 20 0 points  0 points  Months in reverse 0 points 0 points  Repeat phrase 0 points 0 points  Total Score 0 points 0 points    Immunizations Immunization History  Administered Date(s) Administered   Fluad Quad(high Dose 65+) 03/30/2021, 04/12/2023   Influenza, Seasonal, Injecte, Preservative Fre 03/23/2018   Influenza-Unspecified 05/31/2018   MMR 02/23/2008, 03/22/2008   Moderna Sars-Covid-2 Vaccination 06/19/2019, 07/17/2019, 04/05/2020, 01/05/2021   Tdap 03/18/2020   Zoster  Recombinant(Shingrix) 04/14/2022, 07/15/2022    TDAP status: Up to date  Flu Vaccine status: Up to date  Pneumococcal vaccine status: Up to date  Covid-19 vaccine status: Information provided on how to obtain vaccines.   Qualifies for Shingles Vaccine? Yes   Zostavax completed No   Shingrix Completed?: Yes  Screening Tests Health Maintenance  Topic Date Due   Pneumonia Vaccine 73+ Years old (1 of 1 - PCV) Never done   COVID-19 Vaccine (5 - 2024-25 season) 02/13/2023   DEXA SCAN  05/15/2023   Medicare Annual Wellness (AWV)  07/10/2024   MAMMOGRAM  10/28/2024   Colonoscopy  01/07/2027   DTaP/Tdap/Td (2 - Td or Tdap) 03/18/2030   INFLUENZA VACCINE  Completed   Zoster Vaccines- Shingrix  Completed   HPV VACCINES  Aged Out   Hepatitis C Screening  Discontinued    Health Maintenance  Health Maintenance Due  Topic Date Due   Pneumonia Vaccine 47+ Years old (1 of 1 - PCV) Never done   COVID-19 Vaccine (5 - 2024-25 season) 02/13/2023   DEXA SCAN  05/15/2023    Colorectal cancer screening: Type of screening: Colonoscopy. Completed 01/06/17. Repeat every 10 years  Mammogram status: Completed 10/29/22. Repeat every year  Bone Density status: Completed 05/14/21. Results reflect: Bone density results: OSTEOPENIA. Repeat every 2 years. (Scheduled for 01/04/24)  Lung Cancer Screening: (Low Dose CT Chest recommended if Age 63-80 years, 20 pack-year currently smoking OR have quit w/in 15years.) does not qualify.   Lung Cancer Screening Referral: n/a  Additional Screening:  Hepatitis C Screening: does qualify  Vision Screening: Recommended annual ophthalmology exams for early detection of glaucoma and other disorders of the eye. Is the patient up to date with their annual eye exam?  Yes  Who is the provider or what is the name of the office in which the patient attends annual eye exams? [facility]  If pt is not established with a provider, would they like to be referred to a  provider to establish care? No .   Dental Screening: Recommended annual dental exams for proper oral hygiene  Diabetic Foot Exam:   Community Resource Referral / Chronic Care Management: CRR required this visit?  No   CCM required this visit?  No     Plan:     I have personally reviewed and noted the following in the patient's chart:   Medical and social history Use of alcohol, tobacco or illicit drugs  Current medications and supplements including opioid prescriptions. Patient is not currently taking opioid prescriptions. Functional ability and status Nutritional status Physical activity Advanced directives List of other physicians Hospitalizations, surgeries, and ER visits in previous 12 months Vitals Screenings to include cognitive, depression, and falls Referrals and appointments  In addition, I have reviewed and discussed with patient certain preventive protocols, quality metrics, and best practice recommendations. A written personalized care plan for preventive services as well as general preventive health recommendations were provided to patient.     Misty Gomez [place], [place]   1/61/0960   After  Visit Summary: (MyChart) Due to this being a telephonic visit, the after visit summary with patients personalized plan was offered to patient via MyChart   Nurse Notes: No concerns at this time

## 2023-07-11 NOTE — Patient Instructions (Signed)
Ms. Fedie , Thank you for taking time to come for your Medicare Wellness Visit. I appreciate your ongoing commitment to your health goals. Please review the following plan we discussed and let me know if I can assist you in the future.   Referrals/Orders/Follow-Ups/Clinician Recommendations: Aim for 30 minutes of exercise or brisk walking, 6-8 glasses of water, and 5 servings of fruits and vegetables each day.  This is a list of the screening recommended for you and due dates:  Health Maintenance  Topic Date Due   Pneumonia Vaccine (1 of 1 - PCV) Never done   COVID-19 Vaccine (5 - 2024-25 season) 02/13/2023   DEXA scan (bone density measurement)  05/15/2023   Medicare Annual Wellness Visit  07/10/2024   Mammogram  10/28/2024   Colon Cancer Screening  01/07/2027   DTaP/Tdap/Td vaccine (2 - Td or Tdap) 03/18/2030   Flu Shot  Completed   Zoster (Shingles) Vaccine  Completed   HPV Vaccine  Aged Out   Hepatitis C Screening  Discontinued    Advanced directives: (ACP Link)Information on Advanced Care Planning can be found at [facility] [place] Advance Health Care Directives Advance Health Care Directives ([email or web address]   Next Medicare Annual Wellness Visit scheduled for next year: Yes

## 2023-07-25 ENCOUNTER — Ambulatory Visit: Payer: Medicare PPO | Admitting: Nurse Practitioner

## 2023-07-29 DIAGNOSIS — M25561 Pain in right knee: Secondary | ICD-10-CM | POA: Diagnosis not present

## 2023-08-19 DIAGNOSIS — M25561 Pain in right knee: Secondary | ICD-10-CM | POA: Diagnosis not present

## 2023-08-24 ENCOUNTER — Encounter: Payer: Self-pay | Admitting: Family Medicine

## 2023-08-29 ENCOUNTER — Other Ambulatory Visit: Payer: Self-pay

## 2023-08-29 ENCOUNTER — Ambulatory Visit: Attending: Orthopedic Surgery | Admitting: Physical Therapy

## 2023-08-29 ENCOUNTER — Encounter: Payer: Self-pay | Admitting: Physical Therapy

## 2023-08-29 DIAGNOSIS — M25561 Pain in right knee: Secondary | ICD-10-CM | POA: Diagnosis not present

## 2023-08-29 NOTE — Therapy (Signed)
 OUTPATIENT PHYSICAL THERAPY LOWER EXTREMITY EVALUATION   Patient Name: Misty Gomez MRN: [id number] DOB:[birth year], 68 y.o., female Today's Date: 08/29/2023  END OF SESSION:  PT End of Session - 08/29/23 1249     Visit Number 1    Number of Visits 12    Date for PT Re-Evaluation 10/10/23    PT Start Time 1057    PT Stop Time 1150    PT Time Calculation (min) 53 min    Activity Tolerance Patient tolerated treatment well    Behavior During Therapy [facility] for tasks assessed/performed             Past Medical History:  Diagnosis Date   Anemia    Arthritis    Asthma    GERD (gastroesophageal reflux disease)    Hypothyroidism    Thyroid eye disease 2017   Vitamin D deficiency    Past Surgical History:  Procedure Laterality Date   BARTHOLIN GLAND CYST EXCISION     COLONOSCOPY N/A 01/06/2017   Procedure: COLONOSCOPY;  Surgeon: Malissa Hippo, MD;  [facility];  Service: Endoscopy;  Laterality: N/A;  930   POLYPECTOMY  01/06/2017   Procedure: POLYPECTOMY;  Surgeon: Malissa Hippo, MD;  [facility];  Service: Endoscopy;;  colon   Patient Active Problem List   Diagnosis Date Noted   History of asthma 04/14/2022   Mixed hyperlipidemia 04/14/2022   Age-related cataract of both eyes 04/14/2022   Bradycardia 04/14/2022   Thyroid eye disease 04/14/2022   Urge incontinence 04/14/2022   Vitamin D deficiency 02/24/2018   Anemia 02/24/2018   Postablative hypothyroidism 02/24/2018   Osteopenia of lumbar spine 02/24/2018   Special screening for malignant neoplasms, colon 09/09/2016   Other reasons for seeking consultation 12/06/2013    REFERRING PROVIDER: Kristeen Miss MD  REFERRING DIAG: Patellar tendonitis.  Lateral cutaneous nerve pain.    THERAPY DIAG:  Acute pain of right knee  Rationale for Evaluation and Treatment: Rehabilitation  ONSET DATE: ~3-4 months ago.  SUBJECTIVE:   SUBJECTIVE STATEMENT: The patient presents to the clinic with  c/o right knee pain.  She states that it has been ongoing for about 3-4 months especially with kneeling.  She has occasions that even light touch hurts.  Ice and Advil helps decrease pain.  No pain reported unless she presses on her knee.    PERTINENT HISTORY: Right THA.   PAIN:  Are you having pain? No  PRECAUTIONS: Other: Right THA.    RED FLAGS: None   WEIGHT BEARING RESTRICTIONS: No  FALLS:  Has patient fallen in last 6 months? No  LIVING ENVIRONMENT: Lives with: lives with their spouse Lives in: House/apartment Has following equipment at home: None  OCCUPATION: Retired.  PLOF: Independent  PATIENT GOALS: To be able to kneel.      OBJECTIVE:  Note: Objective measures were completed at Evaluation unless otherwise noted.  PATIENT SURVEYS:  LEFS 69/80.  EDEMA:  Circumferential: No right knee edema observed.    PALPATION: Tender to palpation over right patellar fat pads.  LOWER EXTREMITY ROM:  Full active right knee range of motion.  LOWER EXTREMITY MMT:  Normal right knee strength.  LOWER EXTREMITY SPECIAL TESTS:  Knee special tests: Stable right knee.   GAIT: WNL.  TREATMENT DATE: IFC at 80-150 Hz on 40% scan x 20 minutes with vasopneumatic on low.   Normal modality response following removal of modality.  PATIENT EDUCATION:  Education details:  Person educated:  International aid/development worker:  Education comprehension:   HOME EXERCISE PROGRAM:   ASSESSMENT:  CLINICAL IMPRESSION: The patient presents to [facility] with c/o right knee pain that has been ongoing about 3-4 months.  He pain is primarily reproduced when kneeling or putting pressure on her right knee.  She is tender to palpation over her patellar fat pads.  Her range of motion and strength is normal and her knee is stable her LEFS is 69/80.  Patient will benefit from skilled  physical therapy intervention to address pain and deficits.  OBJECTIVE IMPAIRMENTS: decreased activity tolerance and pain.   PARTICIPATION LIMITATIONS: cleaning  PERSONAL FACTORS: 1 comorbidity: right THA  are also affecting patient's functional outcome.   REHAB POTENTIAL: Good  CLINICAL DECISION MAKING: Stable/uncomplicated  EVALUATION COMPLEXITY: Low   GOALS: LONG TERM GOALS: Target date: 10/10/23  Ind with a HEP.  Goal status: INITIAL  2.  Patient able to kneel without right knee pain.  Goal status: INITIAL  3.  Perform ADL's with right knee pain not > 2/10.  Goal status: INITIAL   PLAN:  PT FREQUENCY: 2x/week  PT DURATION: 6 weeks  PLANNED INTERVENTIONS: 97110-Therapeutic exercises, 97530- Therapeutic activity, [id number]- Neuromuscular re-education, 97535- Self Care, 16109- Manual therapy, G0283- Electrical stimulation (unattended), 97016- Vasopneumatic device, 97035- Ultrasound, Patient/Family education, Cryotherapy, and Moist heat  PLAN FOR NEXT SESSION: Pulsed combo e'stim/US, gentle STW/M per patient tolerance.  Iontophoresis.     Rahim Astorga, [place], PT 08/29/2023, 2:40 PM

## 2023-09-01 ENCOUNTER — Ambulatory Visit: Admitting: Physical Therapy

## 2023-09-01 DIAGNOSIS — M25561 Pain in right knee: Secondary | ICD-10-CM | POA: Diagnosis not present

## 2023-09-01 NOTE — Therapy (Addendum)
 OUTPATIENT PHYSICAL THERAPY LOWER EXTREMITY TREATMENT   Patient Name: Misty Gomez MRN: [id number] DOB:Aug 02, 1955, 68 y.o., female Today's Date: 09/01/2023  END OF SESSION:  PT End of Session - 09/01/23 1219     Visit Number 2    Number of Visits 12    Date for PT Re-Evaluation 10/10/23    PT Start Time 1100    PT Stop Time 1154    PT Time Calculation (min) 54 min    Activity Tolerance Patient tolerated treatment well    Behavior During Therapy [facility] for tasks assessed/performed             Past Medical History:  Diagnosis Date   Anemia    Arthritis    Asthma    GERD (gastroesophageal reflux disease)    Hypothyroidism    Thyroid eye disease 2017   Vitamin D deficiency    Past Surgical History:  Procedure Laterality Date   BARTHOLIN GLAND CYST EXCISION     COLONOSCOPY N/A 01/06/2017   Procedure: COLONOSCOPY;  Surgeon: Malissa Hippo, MD;  [facility];  Service: Endoscopy;  Laterality: N/A;  930   POLYPECTOMY  01/06/2017   Procedure: POLYPECTOMY;  Surgeon: Malissa Hippo, MD;  [facility];  Service: Endoscopy;;  colon   Patient Active Problem List   Diagnosis Date Noted   History of asthma 04/14/2022   Mixed hyperlipidemia 04/14/2022   Age-related cataract of both eyes 04/14/2022   Bradycardia 04/14/2022   Thyroid eye disease 04/14/2022   Urge incontinence 04/14/2022   Vitamin D deficiency 02/24/2018   Anemia 02/24/2018   Postablative hypothyroidism 02/24/2018   Osteopenia of lumbar spine 02/24/2018   Special screening for malignant neoplasms, colon 09/09/2016   Other reasons for seeking consultation 12/06/2013    REFERRING PROVIDER: Kristeen Miss MD  REFERRING DIAG: Patellar tendonitis.  Lateral cutaneous nerve pain.    THERAPY DIAG:  Acute pain of right knee  Rationale for Evaluation and Treatment: Rehabilitation  ONSET DATE: ~3-4 months ago.  SUBJECTIVE:   SUBJECTIVE STATEMENT: The same.  PERTINENT HISTORY: Right  THA.   PAIN:  Are you having pain? No  PRECAUTIONS: Other: Right THA.    RED FLAGS: None   WEIGHT BEARING RESTRICTIONS: No  FALLS:  Has patient fallen in last 6 months? No  LIVING ENVIRONMENT: Lives with: lives with their spouse Lives in: House/apartment Has following equipment at home: None  OCCUPATION: Retired.  PLOF: Independent  PATIENT GOALS: To be able to kneel.      OBJECTIVE:  Note: Objective measures were completed at Evaluation unless otherwise noted.  PATIENT SURVEYS:  LEFS 69/80.  EDEMA:  Circumferential: No right knee edema observed.    PALPATION: Tender to palpation over right patellar fat pads.  LOWER EXTREMITY ROM:  Full active right knee range of motion.  LOWER EXTREMITY MMT:  Normal right knee strength.  LOWER EXTREMITY SPECIAL TESTS:  Knee special tests: Stable right knee.   GAIT: WNL.  TREATMENT DATE: 09/01/23:  Pulsed US/combo e'stim with small soundhead at 3.3 mHz at 10% x 12 minutes over both right patellar fat pads f/b STW/M including gentle IASTM  x 11 minutes f/b  IFC at 80-150 Hz on 40% scan x 20 minutes with vasopneumatic on low.   Normal modality response following removal of modality.  PATIENT EDUCATION:  Education details:  Person educated:  International aid/development worker:  Education comprehension:   HOME EXERCISE PROGRAM:   ASSESSMENT:  CLINICAL IMPRESSION: Patient very palpably tender over her left knee patellar fat pads, medial > lateral.  She tolerated treatment fairly well today.    OBJECTIVE IMPAIRMENTS: decreased activity tolerance and pain.   PARTICIPATION LIMITATIONS: cleaning  PERSONAL FACTORS: 1 comorbidity: right THA  are also affecting patient's functional outcome.   REHAB POTENTIAL: Good  CLINICAL DECISION MAKING: Stable/uncomplicated  EVALUATION COMPLEXITY: Low   GOALS: LONG  TERM GOALS: Target date: 10/10/23  Ind with a HEP.  Goal status: INITIAL  2.  Patient able to kneel without right knee pain.  Goal status: INITIAL  3.  Perform ADL's with right knee pain not > 2/10.  Goal status: INITIAL   PLAN:  PT FREQUENCY: 2x/week  PT DURATION: 6 weeks  PLANNED INTERVENTIONS: 97110-Therapeutic exercises, 97530- Therapeutic activity, [id number]- Neuromuscular re-education, 97535- Self Care, 16109- Manual therapy, G0283- Electrical stimulation (unattended), 97016- Vasopneumatic device, 97035- Ultrasound, Patient/Family education, Cryotherapy, and Moist heat  PLAN FOR NEXT SESSION: Pulsed combo e'stim/US, gentle STW/M per patient tolerance.  Iontophoresis.     Aaralyn Kil, [place], PT 09/01/2023, 12:20 PM

## 2023-09-05 ENCOUNTER — Ambulatory Visit: Admitting: Physical Therapy

## 2023-09-05 DIAGNOSIS — M25561 Pain in right knee: Secondary | ICD-10-CM

## 2023-09-05 NOTE — Therapy (Signed)
 OUTPATIENT PHYSICAL THERAPY LOWER EXTREMITY TREATMENT   Patient Name: Misty Gomez MRN: [id number] DOB:[birth year], 68 y.o., female Today's Date: 09/05/2023  END OF SESSION:  PT End of Session - 09/05/23 1052     Visit Number 3    Number of Visits 12    Date for PT Re-Evaluation 10/10/23    PT Start Time 1005    PT Stop Time 1101    PT Time Calculation (min) 56 min    Activity Tolerance Patient tolerated treatment well    Behavior During Therapy [facility] for tasks assessed/performed             Past Medical History:  Diagnosis Date   Anemia    Arthritis    Asthma    GERD (gastroesophageal reflux disease)    Hypothyroidism    Thyroid eye disease 2017   Vitamin D deficiency    Past Surgical History:  Procedure Laterality Date   BARTHOLIN GLAND CYST EXCISION     COLONOSCOPY N/A 01/06/2017   Procedure: COLONOSCOPY;  Surgeon: Malissa Hippo, MD;  [facility];  Service: Endoscopy;  Laterality: N/A;  930   POLYPECTOMY  01/06/2017   Procedure: POLYPECTOMY;  Surgeon: Malissa Hippo, MD;  [facility];  Service: Endoscopy;;  colon   Patient Active Problem List   Diagnosis Date Noted   History of asthma 04/14/2022   Mixed hyperlipidemia 04/14/2022   Age-related cataract of both eyes 04/14/2022   Bradycardia 04/14/2022   Thyroid eye disease 04/14/2022   Urge incontinence 04/14/2022   Vitamin D deficiency 02/24/2018   Anemia 02/24/2018   Postablative hypothyroidism 02/24/2018   Osteopenia of lumbar spine 02/24/2018   Special screening for malignant neoplasms, colon 09/09/2016   Other reasons for seeking consultation 12/06/2013    REFERRING PROVIDER: Kristeen Miss MD  REFERRING DIAG: Patellar tendonitis.  Lateral cutaneous nerve pain.    THERAPY DIAG:  Acute pain of right knee  Rationale for Evaluation and Treatment: Rehabilitation  ONSET DATE: ~3-4 months ago.  SUBJECTIVE:   SUBJECTIVE STATEMENT: Little better.    PERTINENT  HISTORY: Right THA.   PAIN:  Are you having pain? No  PRECAUTIONS: Other: Right THA.    RED FLAGS: None   WEIGHT BEARING RESTRICTIONS: No  FALLS:  Has patient fallen in last 6 months? No  LIVING ENVIRONMENT: Lives with: lives with their spouse Lives in: House/apartment Has following equipment at home: None  OCCUPATION: Retired.  PLOF: Independent  PATIENT GOALS: To be able to kneel.      OBJECTIVE:  Note: Objective measures were completed at Evaluation unless otherwise noted.  PATIENT SURVEYS:  LEFS 69/80.  EDEMA:  Circumferential: No right knee edema observed.    PALPATION: Tender to palpation over right patellar fat pads.  LOWER EXTREMITY ROM:  Full active right knee range of motion.  LOWER EXTREMITY MMT:  Normal right knee strength.  LOWER EXTREMITY SPECIAL TESTS:  Knee special tests: Stable right knee.   GAIT: WNL.  TREATMENT DATE: 09/05/23:  Pulsed US/combo e'stim with small soundhead at 3.3 mHz at 10% x 12 minutes over both right patellar fat pads f/b STW/M x 12  minutes f/b  IFC at 80-150 Hz on 40% scan x 20 minutes with vasopneumatic on low.   Normal modality response following removal of modality.   09/01/23:  Pulsed US/combo e'stim with small soundhead at 3.3 mHz at 10% x 12 minutes over both right patellar fat pads f/b STW/M including gentle IASTM  x 12 minutes f/b  IFC at 80-150 Hz on 40% scan x 20 minutes with vasopneumatic on low.   Normal modality response following removal of modality.  PATIENT EDUCATION:  Education details:  Person educated:  International aid/development worker:  Education comprehension:   HOME EXERCISE PROGRAM:   ASSESSMENT:  CLINICAL IMPRESSION: Some better today.  Patient able to tolerate soft tissue better today.    OBJECTIVE IMPAIRMENTS: decreased activity tolerance and pain.   PARTICIPATION  LIMITATIONS: cleaning  PERSONAL FACTORS: 1 comorbidity: right THA  are also affecting patient's functional outcome.   REHAB POTENTIAL: Good  CLINICAL DECISION MAKING: Stable/uncomplicated  EVALUATION COMPLEXITY: Low   GOALS: LONG TERM GOALS: Target date: 10/10/23  Ind with a HEP.  Goal status: INITIAL  2.  Patient able to kneel without right knee pain.  Goal status: INITIAL  3.  Perform ADL's with right knee pain not > 2/10.  Goal status: INITIAL   PLAN:  PT FREQUENCY: 2x/week  PT DURATION: 6 weeks  PLANNED INTERVENTIONS: 97110-Therapeutic exercises, 97530- Therapeutic activity, [id number]- Neuromuscular re-education, 97535- Self Care, 16109- Manual therapy, G0283- Electrical stimulation (unattended), 97016- Vasopneumatic device, 97035- Ultrasound, Patient/Family education, Cryotherapy, and Moist heat  PLAN FOR NEXT SESSION: Pulsed combo e'stim/US, gentle STW/M per patient tolerance.  Iontophoresis.     Glora Hulgan, [place], PT 09/05/2023, 11:10 AM

## 2023-09-08 ENCOUNTER — Ambulatory Visit: Admitting: Physical Therapy

## 2023-09-08 DIAGNOSIS — M25561 Pain in right knee: Secondary | ICD-10-CM | POA: Diagnosis not present

## 2023-09-08 NOTE — Therapy (Signed)
 OUTPATIENT PHYSICAL THERAPY LOWER EXTREMITY TREATMENT   Patient Name: Misty Gomez MRN: [id number] DOB:[birth year], 68 y.o., female Today's Date: 09/08/2023  END OF SESSION:  PT End of Session - 09/08/23 1228     Visit Number 4    Number of Visits 12    Date for PT Re-Evaluation 10/10/23    PT Start Time 1015    PT Stop Time 1111    PT Time Calculation (min) 56 min    Activity Tolerance Patient tolerated treatment well    Behavior During Therapy [facility] for tasks assessed/performed             Past Medical History:  Diagnosis Date   Anemia    Arthritis    Asthma    GERD (gastroesophageal reflux disease)    Hypothyroidism    Thyroid eye disease 2017   Vitamin D deficiency    Past Surgical History:  Procedure Laterality Date   BARTHOLIN GLAND CYST EXCISION     COLONOSCOPY N/A 01/06/2017   Procedure: COLONOSCOPY;  Surgeon: Malissa Hippo, MD;  [facility];  Service: Endoscopy;  Laterality: N/A;  930   POLYPECTOMY  01/06/2017   Procedure: POLYPECTOMY;  Surgeon: Malissa Hippo, MD;  [facility];  Service: Endoscopy;;  colon   Patient Active Problem List   Diagnosis Date Noted   History of asthma 04/14/2022   Mixed hyperlipidemia 04/14/2022   Age-related cataract of both eyes 04/14/2022   Bradycardia 04/14/2022   Thyroid eye disease 04/14/2022   Urge incontinence 04/14/2022   Vitamin D deficiency 02/24/2018   Anemia 02/24/2018   Postablative hypothyroidism 02/24/2018   Osteopenia of lumbar spine 02/24/2018   Special screening for malignant neoplasms, colon 09/09/2016   Other reasons for seeking consultation 12/06/2013    REFERRING PROVIDER: Kristeen Miss MD  REFERRING DIAG: Patellar tendonitis.  Lateral cutaneous nerve pain.    THERAPY DIAG:  Acute pain of right knee  Rationale for Evaluation and Treatment: Rehabilitation  ONSET DATE: ~3-4 months ago.  SUBJECTIVE:   SUBJECTIVE STATEMENT: Therapy is helping. PERTINENT  HISTORY: Right THA.   PAIN:  Are you having pain? No  PRECAUTIONS: Other: Right THA.    RED FLAGS: None   WEIGHT BEARING RESTRICTIONS: No  FALLS:  Has patient fallen in last 6 months? No  LIVING ENVIRONMENT: Lives with: lives with their spouse Lives in: House/apartment Has following equipment at home: None  OCCUPATION: Retired.  PLOF: Independent  PATIENT GOALS: To be able to kneel.      OBJECTIVE:  Note: Objective measures were completed at Evaluation unless otherwise noted.  PATIENT SURVEYS:  LEFS 69/80.  EDEMA:  Circumferential: No right knee edema observed.    PALPATION: Tender to palpation over right patellar fat pads.  LOWER EXTREMITY ROM:  Full active right knee range of motion.  LOWER EXTREMITY MMT:  Normal right knee strength.  LOWER EXTREMITY SPECIAL TESTS:  Knee special tests: Stable right knee.   GAIT: WNL.  TREATMENT DATE:  09/08/23:  Recumbent bike x 10 minutes f/b pulsed US/combo e'stim at 3.3 mHz at 10% x 7 minutes over both right patellar fat pads f/b STW/M x 8  minutes f/b  IFC at 80-150 Hz on 40% scan x 20 minutes with vasopneumatic on low.   Normal modality response following removal of modality.  09/05/23:  Pulsed US/combo e'stim with small soundhead at 3.3 mHz at 10% x 12 minutes over both right patellar fat pads f/b STW/M x 12  minutes f/b  IFC at 80-150 Hz on 40% scan x 20 minutes with vasopneumatic on low.   Normal modality response following removal of modality.   09/01/23:  Pulsed US/combo e'stim with small soundhead at 3.3 mHz at 10% x 12 minutes over both right patellar fat pads f/b STW/M including gentle IASTM  x 12 minutes f/b  IFC at 80-150 Hz on 40% scan x 20 minutes with vasopneumatic on low.   Normal modality response following removal of modality.  PATIENT EDUCATION:  Education details:  Person  educated:  International aid/development worker:  Education comprehension:   HOME EXERCISE PROGRAM:   ASSESSMENT:  CLINICAL IMPRESSION: Patient stating treatments are definitely helping and she is less palpably tender over her left knee patellar fat pads.   OBJECTIVE IMPAIRMENTS: decreased activity tolerance and pain.   PARTICIPATION LIMITATIONS: cleaning  PERSONAL FACTORS: 1 comorbidity: right THA  are also affecting patient's functional outcome.   REHAB POTENTIAL: Good  CLINICAL DECISION MAKING: Stable/uncomplicated  EVALUATION COMPLEXITY: Low   GOALS: LONG TERM GOALS: Target date: 10/10/23  Ind with a HEP.  Goal status: INITIAL  2.  Patient able to kneel without right knee pain.  Goal status: INITIAL  3.  Perform ADL's with right knee pain not > 2/10.  Goal status: INITIAL   PLAN:  PT FREQUENCY: 2x/week  PT DURATION: 6 weeks  PLANNED INTERVENTIONS: 97110-Therapeutic exercises, 97530- Therapeutic activity, [id number]- Neuromuscular re-education, 97535- Self Care, 16109- Manual therapy, G0283- Electrical stimulation (unattended), 97016- Vasopneumatic device, 97035- Ultrasound, Patient/Family education, Cryotherapy, and Moist heat  PLAN FOR NEXT SESSION: Pulsed combo e'stim/US, gentle STW/M per patient tolerance.  Iontophoresis.     Donye Dauenhauer, [place], PT 09/08/2023, 12:31 PM

## 2023-09-12 ENCOUNTER — Ambulatory Visit: Admitting: Physical Therapy

## 2023-09-12 DIAGNOSIS — M25561 Pain in right knee: Secondary | ICD-10-CM

## 2023-09-12 NOTE — Addendum Note (Signed)
 Addended by: Jamael Hoffmann, [place] W on: 09/12/2023 03:13 PM   Modules accepted: Orders

## 2023-09-12 NOTE — Therapy (Signed)
 OUTPATIENT PHYSICAL THERAPY LOWER EXTREMITY TREATMENT   Patient Name: Misty Gomez MRN: [id number] DOB:[birth year], 68 y.o., female Today's Date: 09/12/2023  END OF SESSION:  PT End of Session - 09/12/23 1514     Visit Number 5    Number of Visits 12    Date for PT Re-Evaluation 10/10/23    PT Start Time 0230    PT Stop Time 0322    PT Time Calculation (min) 52 min    Activity Tolerance Patient tolerated treatment well    Behavior During Therapy [facility] for tasks assessed/performed             Past Medical History:  Diagnosis Date   Anemia    Arthritis    Asthma    GERD (gastroesophageal reflux disease)    Hypothyroidism    Thyroid eye disease 2017   Vitamin D deficiency    Past Surgical History:  Procedure Laterality Date   BARTHOLIN GLAND CYST EXCISION     COLONOSCOPY N/A 01/06/2017   Procedure: COLONOSCOPY;  Surgeon: Malissa Hippo, MD;  [facility];  Service: Endoscopy;  Laterality: N/A;  930   POLYPECTOMY  01/06/2017   Procedure: POLYPECTOMY;  Surgeon: Malissa Hippo, MD;  [facility];  Service: Endoscopy;;  colon   Patient Active Problem List   Diagnosis Date Noted   History of asthma 04/14/2022   Mixed hyperlipidemia 04/14/2022   Age-related cataract of both eyes 04/14/2022   Bradycardia 04/14/2022   Thyroid eye disease 04/14/2022   Urge incontinence 04/14/2022   Vitamin D deficiency 02/24/2018   Anemia 02/24/2018   Postablative hypothyroidism 02/24/2018   Osteopenia of lumbar spine 02/24/2018   Special screening for malignant neoplasms, colon 09/09/2016   Other reasons for seeking consultation 12/06/2013    REFERRING PROVIDER: Kristeen Miss MD  REFERRING DIAG: Patellar tendonitis.  Lateral cutaneous nerve pain.    THERAPY DIAG:  Acute pain of right knee  Rationale for Evaluation and Treatment: Rehabilitation  ONSET DATE: ~3-4 months ago.  SUBJECTIVE:   SUBJECTIVE STATEMENT: Bothering me over the  weekend.  PERTINENT HISTORY: Right THA.   PAIN:  Are you having pain? No  PRECAUTIONS: Other: Right THA.    RED FLAGS: None   WEIGHT BEARING RESTRICTIONS: No  FALLS:  Has patient fallen in last 6 months? No  LIVING ENVIRONMENT: Lives with: lives with their spouse Lives in: House/apartment Has following equipment at home: None  OCCUPATION: Retired.  PLOF: Independent  PATIENT GOALS: To be able to kneel.      OBJECTIVE:  Note: Objective measures were completed at Evaluation unless otherwise noted.  PATIENT SURVEYS:  LEFS 69/80.  EDEMA:  Circumferential: No right knee edema observed.    PALPATION: Tender to palpation over right patellar fat pads.  LOWER EXTREMITY ROM:  Full active right knee range of motion.  LOWER EXTREMITY MMT:  Normal right knee strength.  LOWER EXTREMITY SPECIAL TESTS:  Knee special tests: Stable right knee.   GAIT: WNL.  TREATMENT DATE:  09/12/23:  Recumbent bike x 10 minutes f/b STW/M x 13 minutes to patient's right hip incisional site and quadriceps f/b HMP and  IFC at 80-150 Hz on 40% scan x 20 minutes with vasopneumatic on low.   Normal modality response following removal of modality.  09/08/23:  Recumbent bike x 10 minutes f/b pulsed US/combo e'stim at 3.3 mHz at 10% x 7 minutes over both right patellar fat pads f/b STW/M x 8  minutes f/b  IFC at 80-150 Hz on 40% scan x 20 minutes with vasopneumatic on low.   Normal modality response following removal of modality.  PATIENT EDUCATION:  Education details:  Person educated:  International aid/development worker:  Education comprehension:   HOME EXERCISE PROGRAM:   ASSESSMENT:  CLINICAL IMPRESSION: Patient with active trigger point in right lateral quadriceps.   OBJECTIVE IMPAIRMENTS: decreased activity tolerance and pain.   PARTICIPATION LIMITATIONS:  cleaning  PERSONAL FACTORS: 1 comorbidity: right THA  are also affecting patient's functional outcome.   REHAB POTENTIAL: Good  CLINICAL DECISION MAKING: Stable/uncomplicated  EVALUATION COMPLEXITY: Low   GOALS: LONG TERM GOALS: Target date: 10/10/23  Ind with a HEP.  Goal status: INITIAL  2.  Patient able to kneel without right knee pain.  Goal status: INITIAL  3.  Perform ADL's with right knee pain not > 2/10.  Goal status: INITIAL   PLAN:  PT FREQUENCY: 2x/week  PT DURATION: 6 weeks  PLANNED INTERVENTIONS: 97110-Therapeutic exercises, 97530- Therapeutic activity, [id number]- Neuromuscular re-education, 97535- Self Care, 16109- Manual therapy, G0283- Electrical stimulation (unattended), 97016- Vasopneumatic device, 97035- Ultrasound, Patient/Family education, Cryotherapy, and Moist heat  PLAN FOR NEXT SESSION: Pulsed combo e'stim/US, gentle STW/M per patient tolerance.  Iontophoresis.     Tadhg Eskew, [place], PT 09/12/2023, 3:29 PM

## 2023-09-14 ENCOUNTER — Encounter: Payer: Self-pay | Admitting: Family Medicine

## 2023-09-14 ENCOUNTER — Ambulatory Visit: Attending: Orthopedic Surgery | Admitting: Physical Therapy

## 2023-09-14 DIAGNOSIS — G5711 Meralgia paresthetica, right lower limb: Secondary | ICD-10-CM

## 2023-09-14 DIAGNOSIS — M79651 Pain in right thigh: Secondary | ICD-10-CM | POA: Diagnosis not present

## 2023-09-14 DIAGNOSIS — M25561 Pain in right knee: Secondary | ICD-10-CM | POA: Insufficient documentation

## 2023-09-14 DIAGNOSIS — M25551 Pain in right hip: Secondary | ICD-10-CM

## 2023-09-14 NOTE — Therapy (Signed)
 OUTPATIENT PHYSICAL THERAPY LOWER EXTREMITY TREATMENT   Patient Name: Misty Gomez MRN: [id number] DOB:[birth year], 68 y.o., female Today's Date: 09/14/2023  END OF SESSION:  PT End of Session - 09/14/23 1515     Visit Number 6    Date for PT Re-Evaluation 10/10/23    PT Start Time 0141    PT Stop Time 0239    PT Time Calculation (min) 58 min    Activity Tolerance Patient tolerated treatment well    Behavior During Therapy [facility] for tasks assessed/performed              Past Medical History:  Diagnosis Date   Anemia    Arthritis    Asthma    GERD (gastroesophageal reflux disease)    Hypothyroidism    Thyroid eye disease 2017   Vitamin D deficiency    Past Surgical History:  Procedure Laterality Date   BARTHOLIN GLAND CYST EXCISION     COLONOSCOPY N/A 01/06/2017   Procedure: COLONOSCOPY;  Surgeon: Malissa Hippo, MD;  [facility];  Service: Endoscopy;  Laterality: N/A;  930   POLYPECTOMY  01/06/2017   Procedure: POLYPECTOMY;  Surgeon: Malissa Hippo, MD;  [facility];  Service: Endoscopy;;  colon   Patient Active Problem List   Diagnosis Date Noted   History of asthma 04/14/2022   Mixed hyperlipidemia 04/14/2022   Age-related cataract of both eyes 04/14/2022   Bradycardia 04/14/2022   Thyroid eye disease 04/14/2022   Urge incontinence 04/14/2022   Vitamin D deficiency 02/24/2018   Anemia 02/24/2018   Postablative hypothyroidism 02/24/2018   Osteopenia of lumbar spine 02/24/2018   Special screening for malignant neoplasms, colon 09/09/2016   Other reasons for seeking consultation 12/06/2013    REFERRING PROVIDER: Kristeen Miss MD  REFERRING DIAG: Patellar tendonitis.  Lateral cutaneous nerve pain.    THERAPY DIAG:  Acute pain of right knee  Pain in right thigh  Rationale for Evaluation and Treatment: Rehabilitation  ONSET DATE: ~3-4 months ago.  SUBJECTIVE:   SUBJECTIVE STATEMENT: Bothering me over the  weekend.  PERTINENT HISTORY: Right THA.   PAIN:  Are you having pain? No  PRECAUTIONS: Other: Right THA.    RED FLAGS: None   WEIGHT BEARING RESTRICTIONS: No  FALLS:  Has patient fallen in last 6 months? No  LIVING ENVIRONMENT: Lives with: lives with their spouse Lives in: House/apartment Has following equipment at home: None  OCCUPATION: Retired.  PLOF: Independent  PATIENT GOALS: To be able to kneel.      OBJECTIVE:  Note: Objective measures were completed at Evaluation unless otherwise noted.  PATIENT SURVEYS:  LEFS 69/80.  EDEMA:  Circumferential: No right knee edema observed.    PALPATION: Tender to palpation over right patellar fat pads.  LOWER EXTREMITY ROM:  Full active right knee range of motion.  LOWER EXTREMITY MMT:  Normal right knee strength.  LOWER EXTREMITY SPECIAL TESTS:  Knee special tests: Stable right knee.   GAIT: WNL.  TREATMENT DATE:  09/14/23:  Trigger Point Dry Needling  What is Trigger Point Dry Needling (DN)? DN is a physical therapy technique used to treat muscle pain and dysfunction. Specifically, DN helps deactivate muscle trigger points (muscle knots).  A thin filiform needle is used to penetrate the skin and stimulate the underlying trigger point. The goal is for a local twitch response (LTR) to occur and for the trigger point to relax. No medication of any kind is injected during the procedure.   What Does Trigger Point Dry Needling Feel Like?  The procedure feels different for each individual patient. Some patients report that they do not actually feel the needle enter the skin and overall the process is not painful. Very mild bleeding may occur. However, many patients feel a deep cramping in the muscle in which the needle was inserted. This is the local twitch response.   How Will I feel  after the treatment? Soreness is normal, and the onset of soreness may not occur for a few hours. Typically this soreness does not last longer than two days.  Bruising is uncommon, however; ice can be used to decrease any possible bruising.  In rare cases feeling tired or nauseous after the treatment is normal. In addition, your symptoms may get worse before they get better, this period will typically not last longer than 24 hours.   What Can I do After My Treatment? Increase your hydration by drinking more water for the next 24 hours.  You may place ice or heat on the areas treated that have become sore, however, do not use heat on inflamed or bruised areas. Heat often brings more relief post needling. You can continue your regular activities, but vigorous activity is not recommended initially after the treatment for 24 hours. DN is best combined with other physical therapy such as strengthening, stretching, and other therapies.   What are the complications? While your therapist has had extensive training in minimizing the risks of trigger point dry needling, it is important to understand the risks of any procedure.  Risks include bleeding, pain, fatigue, hematoma, infection, vertigo, nausea or nerve involvement. Monitor for any changes to your skin or sensation. Contact your therapist or MD with concerns.  A rare but serious complication is a pneumothorax over or near your middle and upper chest and back If you have dry needling in this area, monitor for the following symptoms: Shortness of breath on exertion and/or Difficulty taking a deep breath and/or Chest Pain and/or A dry cough If any of the above symptoms develop, please go to the nearest emergency room or call 911. Tell them you had dry needling over your thorax and report any symptoms you are having. Please follow-up with your treating therapist after you complete the medical evaluation.  Trigger Point Dry Needling   Patient Verbal  Consent Given: Yes Education Handout Provided: Yes Muscles Treated: Right mid/lateral quadriceps region.  09/13/23:  f/b STW/M x 23 minutes from right hip incisional site and over right quadriceps f/b IFC at 80-150 Hz x 20 minutes.   Normal modality response following removal of modality.    09/12/23:  Recumbent bike x 10 minutes f/b STW/M x 13 minutes to patient's right hip incisional site and quadriceps f/b HMP and  IFC at 80-150 Hz on 40% scan x 20 minutes with vasopneumatic on low.   Normal modality response following removal of modality.  09/08/23:  Recumbent bike x 10 minutes f/b pulsed US/combo e'stim at 3.3 mHz at  10% x 7 minutes over both right patellar fat pads f/b STW/M x 8  minutes f/b  IFC at 80-150 Hz on 40% scan x 20 minutes with vasopneumatic on low.   Normal modality response following removal of modality.  PATIENT EDUCATION:  Education details:  Person educated:  International aid/development worker:  Education comprehension:   HOME EXERCISE PROGRAM:   ASSESSMENT:  CLINICAL IMPRESSION: The patient did well with dry needling to her right lateral quadriceps/mid-portion of musculature.  She is considering a NCV test.   OBJECTIVE IMPAIRMENTS: decreased activity tolerance and pain.   PARTICIPATION LIMITATIONS: cleaning  PERSONAL FACTORS: 1 comorbidity: right THA  are also affecting patient's functional outcome.   REHAB POTENTIAL: Good  CLINICAL DECISION MAKING: Stable/uncomplicated  EVALUATION COMPLEXITY: Low   GOALS: LONG TERM GOALS: Target date: 10/10/23  Ind with a HEP.  Goal status: INITIAL  2.  Patient able to kneel without right knee pain.  Goal status: INITIAL  3.  Perform ADL's with right knee pain not > 2/10.  Goal status: INITIAL   PLAN:  PT FREQUENCY: 2x/week  PT DURATION: 6 weeks  PLANNED INTERVENTIONS: 97110-Therapeutic exercises, 97530- Therapeutic activity, [id number]- Neuromuscular re-education, 97535- Self Care, [id number]- Manual therapy, G0283- Electrical  stimulation (unattended), 97016- Vasopneumatic device, 97035- Ultrasound, Patient/Family education, Cryotherapy, and Moist heat  PLAN FOR NEXT SESSION: Pulsed combo e'stim/US, gentle STW/M per patient tolerance.  Iontophoresis.     Marice Guidone, [place], PT 09/14/2023, 3:16 PM

## 2023-09-19 ENCOUNTER — Ambulatory Visit: Admitting: Physical Therapy

## 2023-09-19 ENCOUNTER — Encounter: Payer: Self-pay | Admitting: Physical Therapy

## 2023-09-19 DIAGNOSIS — M25561 Pain in right knee: Secondary | ICD-10-CM | POA: Diagnosis not present

## 2023-09-19 DIAGNOSIS — M79651 Pain in right thigh: Secondary | ICD-10-CM | POA: Diagnosis not present

## 2023-09-19 NOTE — Therapy (Signed)
 OUTPATIENT PHYSICAL THERAPY LOWER EXTREMITY TREATMENT   Patient Name: Misty Gomez MRN: [id number] DOB:19-Mar-1956, 68 y.o., female Today's Date: 09/19/2023  END OF SESSION:  PT End of Session - 09/19/23 1509     Visit Number 7    Number of Visits 12    Date for PT Re-Evaluation 10/10/23    PT Start Time 0230    PT Stop Time 0324    PT Time Calculation (min) 54 min    Activity Tolerance Patient tolerated treatment well    Behavior During Therapy [facility] for tasks assessed/performed              Past Medical History:  Diagnosis Date   Anemia    Arthritis    Asthma    GERD (gastroesophageal reflux disease)    Hypothyroidism    Thyroid eye disease 2017   Vitamin D deficiency    Past Surgical History:  Procedure Laterality Date   BARTHOLIN GLAND CYST EXCISION     COLONOSCOPY N/A 01/06/2017   Procedure: COLONOSCOPY;  Surgeon: Malissa Hippo, MD;  [facility];  Service: Endoscopy;  Laterality: N/A;  930   POLYPECTOMY  01/06/2017   Procedure: POLYPECTOMY;  Surgeon: Malissa Hippo, MD;  [facility];  Service: Endoscopy;;  colon   Patient Active Problem List   Diagnosis Date Noted   History of asthma 04/14/2022   Mixed hyperlipidemia 04/14/2022   Age-related cataract of both eyes 04/14/2022   Bradycardia 04/14/2022   Thyroid eye disease 04/14/2022   Urge incontinence 04/14/2022   Vitamin D deficiency 02/24/2018   Anemia 02/24/2018   Postablative hypothyroidism 02/24/2018   Osteopenia of lumbar spine 02/24/2018   Special screening for malignant neoplasms, colon 09/09/2016   Other reasons for seeking consultation 12/06/2013    REFERRING PROVIDER: Kristeen Miss MD  REFERRING DIAG: Patellar tendonitis.  Lateral cutaneous nerve pain.    THERAPY DIAG:  Acute pain of right knee  Pain in right thigh  Rationale for Evaluation and Treatment: Rehabilitation  ONSET DATE: ~3-4 months ago.  SUBJECTIVE:   SUBJECTIVE STATEMENT: Dry needling  helped.  PERTINENT HISTORY: Right THA.   PAIN:  Are you having pain? No  PRECAUTIONS: Other: Right THA.    RED FLAGS: None   WEIGHT BEARING RESTRICTIONS: No  FALLS:  Has patient fallen in last 6 months? No  LIVING ENVIRONMENT: Lives with: lives with their spouse Lives in: House/apartment Has following equipment at home: None  OCCUPATION: Retired.  PLOF: Independent  PATIENT GOALS: To be able to kneel.      OBJECTIVE:  Note: Objective measures were completed at Evaluation unless otherwise noted.  PATIENT SURVEYS:  LEFS 69/80.  EDEMA:  Circumferential: No right knee edema observed.    PALPATION: Tender to palpation over right patellar fat pads.  LOWER EXTREMITY ROM:  Full active right knee range of motion.  LOWER EXTREMITY MMT:  Normal right knee strength.  LOWER EXTREMITY SPECIAL TESTS:  Knee special tests: Stable right knee.   GAIT: WNL.  TREATMENT DATE:  09/19/23:  Recumbent bike level 3 x 11 minutes f/b STW/M x 12 minutes to patient's right anterior hip (incisional region) and quadriceps f/b HMP and IFC at 80-150 Hz on 40% scan x 20 minutes.    PATIENT EDUCATION:  Education details:  Person educated:  International aid/development worker:  Education comprehension:   HOME EXERCISE PROGRAM:   ASSESSMENT:  CLINICAL IMPRESSION: Patient states he she a good response to dry needling.  She did well with treatment today.  STW/M was focused on right hip incisional site region.    OBJECTIVE IMPAIRMENTS: decreased activity tolerance and pain.   PARTICIPATION LIMITATIONS: cleaning  PERSONAL FACTORS: 1 comorbidity: right THA  are also affecting patient's functional outcome.   REHAB POTENTIAL: Good  CLINICAL DECISION MAKING: Stable/uncomplicated  EVALUATION COMPLEXITY: Low   GOALS: LONG TERM GOALS: Target date: 10/10/23  Ind with a  HEP.  Goal status: INITIAL  2.  Patient able to kneel without right knee pain.  Goal status: INITIAL  3.  Perform ADL's with right knee pain not > 2/10.  Goal status: INITIAL   PLAN:  PT FREQUENCY: 2x/week  PT DURATION: 6 weeks  PLANNED INTERVENTIONS: 97110-Therapeutic exercises, 97530- Therapeutic activity, [id number]- Neuromuscular re-education, 97535- Self Care, 16109- Manual therapy, G0283- Electrical stimulation (unattended), 97016- Vasopneumatic device, 97035- Ultrasound, Patient/Family education, Cryotherapy, and Moist heat  PLAN FOR NEXT SESSION: Pulsed combo e'stim/US, gentle STW/M per patient tolerance.  Iontophoresis.     Johnnie Goynes, [place], PT 09/19/2023, 3:57 PM

## 2023-09-22 ENCOUNTER — Ambulatory Visit: Admitting: Physical Therapy

## 2023-09-22 DIAGNOSIS — M25561 Pain in right knee: Secondary | ICD-10-CM | POA: Diagnosis not present

## 2023-09-22 DIAGNOSIS — M79651 Pain in right thigh: Secondary | ICD-10-CM

## 2023-09-22 NOTE — Therapy (Signed)
 OUTPATIENT PHYSICAL THERAPY LOWER EXTREMITY TREATMENT   Patient Name: Misty Gomez MRN: [id number] DOB:1956/04/09, 68 y.o., female Today's Date: 09/22/2023  END OF SESSION:  PT End of Session - 09/22/23 1059     Visit Number 8    Number of Visits 10    Date for PT Re-Evaluation 10/10/23    PT Start Time 1015    PT Stop Time 1108    PT Time Calculation (min) 53 min    Activity Tolerance Patient tolerated treatment well    Behavior During Therapy [facility] for tasks assessed/performed              Past Medical History:  Diagnosis Date   Anemia    Arthritis    Asthma    GERD (gastroesophageal reflux disease)    Hypothyroidism    Thyroid eye disease 2017   Vitamin D deficiency    Past Surgical History:  Procedure Laterality Date   BARTHOLIN GLAND CYST EXCISION     COLONOSCOPY N/A 01/06/2017   Procedure: COLONOSCOPY;  Surgeon: Malissa Hippo, MD;  [facility];  Service: Endoscopy;  Laterality: N/A;  930   POLYPECTOMY  01/06/2017   Procedure: POLYPECTOMY;  Surgeon: Malissa Hippo, MD;  [facility];  Service: Endoscopy;;  colon   Patient Active Problem List   Diagnosis Date Noted   History of asthma 04/14/2022   Mixed hyperlipidemia 04/14/2022   Age-related cataract of both eyes 04/14/2022   Bradycardia 04/14/2022   Thyroid eye disease 04/14/2022   Urge incontinence 04/14/2022   Vitamin D deficiency 02/24/2018   Anemia 02/24/2018   Postablative hypothyroidism 02/24/2018   Osteopenia of lumbar spine 02/24/2018   Special screening for malignant neoplasms, colon 09/09/2016   Other reasons for seeking consultation 12/06/2013    REFERRING PROVIDER: Kristeen Miss MD  REFERRING DIAG: Patellar tendonitis.  Lateral cutaneous nerve pain.    THERAPY DIAG:  Acute pain of right knee  Pain in right thigh  Rationale for Evaluation and Treatment: Rehabilitation  ONSET DATE: ~3-4 months ago.  SUBJECTIVE:   SUBJECTIVE STATEMENT: Doing better.   Pain is getting less and less frequent.  PERTINENT HISTORY: Right THA.   PAIN:  Are you having pain? No  PRECAUTIONS: Other: Right THA.    RED FLAGS: None   WEIGHT BEARING RESTRICTIONS: No  FALLS:  Has patient fallen in last 6 months? No  LIVING ENVIRONMENT: Lives with: lives with their spouse Lives in: House/apartment Has following equipment at home: None  OCCUPATION: Retired.  PLOF: Independent  PATIENT GOALS: To be able to kneel.      OBJECTIVE:  Note: Objective measures were completed at Evaluation unless otherwise noted.  PATIENT SURVEYS:  LEFS 69/80.  EDEMA:  Circumferential: No right knee edema observed.    PALPATION: Tender to palpation over right patellar fat pads.  LOWER EXTREMITY ROM:  Full active right knee range of motion.  LOWER EXTREMITY MMT:  Normal right knee strength.  LOWER EXTREMITY SPECIAL TESTS:  Knee special tests: Stable right knee.   GAIT: WNL.  TREATMENT DATE:  09/22/23:  Recumbent bike level 3 x 10 minutes f/b DN to right lateral quad f/b STW/M x 13 minutes to patient's right anterior hip (incisional region) and quadriceps f/b HMP and IFC at 80-150 Hz on 40% scan x 20 minutes.      09/19/23:  Recumbent bike level 3 x 11 minutes f/b STW/M x 12 minutes to patient's right anterior hip (incisional region) and quadriceps f/b HMP and IFC at 80-150 Hz on 40% scan x 20 minutes.    PATIENT EDUCATION:  Education details:  Person educated:  International aid/development worker:  Education comprehension:   HOME EXERCISE PROGRAM:   ASSESSMENT:  CLINICAL IMPRESSION: Patient reporting less overall pain and pain less frequently.  He tolerance to palpation over her right knee is much improved.    OBJECTIVE IMPAIRMENTS: decreased activity tolerance and pain.   PARTICIPATION LIMITATIONS: cleaning  PERSONAL FACTORS: 1  comorbidity: right THA  are also affecting patient's functional outcome.   REHAB POTENTIAL: Good  CLINICAL DECISION MAKING: Stable/uncomplicated  EVALUATION COMPLEXITY: Low   GOALS: LONG TERM GOALS: Target date: 10/10/23  Ind with a HEP.  Goal status: INITIAL  2.  Patient able to kneel without right knee pain.  Goal status: INITIAL  3.  Perform ADL's with right knee pain not > 2/10.  Goal status: INITIAL   PLAN:  PT FREQUENCY: 2x/week  PT DURATION: 6 weeks  PLANNED INTERVENTIONS: 97110-Therapeutic exercises, 97530- Therapeutic activity, [id number]- Neuromuscular re-education, 97535- Self Care, 09811- Manual therapy, G0283- Electrical stimulation (unattended), 97016- Vasopneumatic device, 97035- Ultrasound, Patient/Family education, Cryotherapy, and Moist heat  PLAN FOR NEXT SESSION: Pulsed combo e'stim/US, gentle STW/M per patient tolerance.  Iontophoresis.     [name], [place], PT 09/22/2023, 11:38 AM

## 2023-09-29 ENCOUNTER — Ambulatory Visit: Admitting: Physical Therapy

## 2023-09-29 DIAGNOSIS — M79651 Pain in right thigh: Secondary | ICD-10-CM | POA: Diagnosis not present

## 2023-09-29 DIAGNOSIS — M25561 Pain in right knee: Secondary | ICD-10-CM

## 2023-09-29 NOTE — Therapy (Addendum)
 OUTPATIENT PHYSICAL THERAPY LOWER EXTREMITY TREATMENT   Patient Name: Misty Gomez MRN: [id number] DOB:Jul 20, 1955, 68 y.o., female Today's Date: 09/29/2023  END OF SESSION:  PT End of Session - 09/29/23 1616     Visit Number 9    Number of Visits 10    Date for PT Re-Evaluation 10/10/23    PT Start Time 0230    PT Stop Time 0326    PT Time Calculation (min) 56 min    Activity Tolerance Patient tolerated treatment well    Behavior During Therapy [facility] for tasks assessed/performed              Past Medical History:  Diagnosis Date   Anemia    Arthritis    Asthma    GERD (gastroesophageal reflux disease)    Hypothyroidism    Thyroid  eye disease 2017   Vitamin D  deficiency    Past Surgical History:  Procedure Laterality Date   BARTHOLIN GLAND CYST EXCISION     COLONOSCOPY N/A 01/06/2017   Procedure: COLONOSCOPY;  Surgeon: Golda Claudis PENNER, MD;  [facility];  Service: Endoscopy;  Laterality: N/A;  930   POLYPECTOMY  01/06/2017   Procedure: POLYPECTOMY;  Surgeon: Golda Claudis PENNER, MD;  [facility];  Service: Endoscopy;;  colon   Patient Active Problem List   Diagnosis Date Noted   History of asthma 04/14/2022   Mixed hyperlipidemia 04/14/2022   Age-related cataract of both eyes 04/14/2022   Bradycardia 04/14/2022   Thyroid  eye disease 04/14/2022   Urge incontinence 04/14/2022   Vitamin D  deficiency 02/24/2018   Anemia 02/24/2018   Postablative hypothyroidism 02/24/2018   Osteopenia of lumbar spine 02/24/2018   Special screening for malignant neoplasms, colon 09/09/2016   Other reasons for seeking consultation 12/06/2013    REFERRING PROVIDER: Rolan Silos MD  REFERRING DIAG: Patellar tendonitis.  Lateral cutaneous nerve pain.    THERAPY DIAG:  Acute pain of right knee  Pain in right thigh  Rationale for Evaluation and Treatment: Rehabilitation  ONSET DATE: ~3-4 months ago.  SUBJECTIVE:   SUBJECTIVE STATEMENT: About 70%  better.  Have a NCV scheduled.  Patient would like to be placed on hold after treatment today.   PERTINENT HISTORY: Right THA.   PAIN:  Are you having pain? No  PRECAUTIONS: Other: Right THA.    RED FLAGS: None   WEIGHT BEARING RESTRICTIONS: No  FALLS:  Has patient fallen in last 6 months? No  LIVING ENVIRONMENT: Lives with: lives with their spouse Lives in: House/apartment Has following equipment at home: None  OCCUPATION: Retired.  PLOF: Independent  PATIENT GOALS: To be able to kneel.      OBJECTIVE:  Note: Objective measures were completed at Evaluation unless otherwise noted.  PATIENT SURVEYS:  LEFS 69/80.  EDEMA:  Circumferential: No right knee edema observed.    PALPATION: Tender to palpation over right patellar fat pads.  LOWER EXTREMITY ROM:  Full active right knee range of motion.  LOWER EXTREMITY MMT:  Normal right knee strength.  LOWER EXTREMITY SPECIAL TESTS:  Knee special tests: Stable right knee.   GAIT: WNL.  TREATMENT DATE: 09/29/23:    Recumbent bike level 3 x 11 minutes f/b DN to right lateral quad f/b STW/M x 12 minutes to patient's right anterior hip (incisional region) and quadriceps f/b HMP and IFC at 80-150 Hz on 40% scan x 20 minutes.  Normal modality response following removal of modality.    09/19/23:  Recumbent bike level 3 x 11 minutes f/b STW/M x 12 minutes to patient's right anterior hip (incisional region) and quadriceps f/b HMP and IFC at 80-150 Hz on 40% scan x 20 minutes.    PATIENT EDUCATION:  Education details:  Person educated:  International aid/development worker:  Education comprehension:   HOME EXERCISE PROGRAM:   ASSESSMENT:  CLINICAL IMPRESSION: Patient reporting a subjective improvement rating at 70%.  She did very well with treatment today.  She would like to be placed on hold until after her  NCV.  OBJECTIVE IMPAIRMENTS: decreased activity tolerance and pain.   PARTICIPATION LIMITATIONS: cleaning  PERSONAL FACTORS: 1 comorbidity: right THA are also affecting patient's functional outcome.   REHAB POTENTIAL: Good  CLINICAL DECISION MAKING: Stable/uncomplicated  EVALUATION COMPLEXITY: Low   GOALS: LONG TERM GOALS: Target date: 10/10/23  Ind with a HEP.  Goal status: INITIAL  2.  Patient able to kneel without right knee pain.  Goal status: INITIAL  3.  Perform ADL's with right knee pain not > 2/10.  Goal status: INITIAL   PLAN:  PT FREQUENCY: 2x/week  PT DURATION: 6 weeks  PLANNED INTERVENTIONS: 97110-Therapeutic exercises, 97530- Therapeutic activity, [id number]- Neuromuscular re-education, 97535- Self Care, 02859- Manual therapy, G0283- Electrical stimulation (unattended), 97016- Vasopneumatic device, 97035- Ultrasound, Patient/Family education, Cryotherapy, and Moist heat  PLAN FOR NEXT SESSION: Pulsed combo e'stim/US , gentle STW/M per patient tolerance.  Iontophoresis.     Ziv Welchel, [place], PT 09/29/2023, 4:33 PM   PHYSICAL THERAPY DISCHARGE SUMMARY  Visits from Start of Care: 9.  Current functional level related to goals / functional outcomes: See above.   Remaining deficits: See below.   Education / Equipment: HEP.   Dequavius Kuhner MPT

## 2023-11-02 DIAGNOSIS — Z1231 Encounter for screening mammogram for malignant neoplasm of breast: Secondary | ICD-10-CM | POA: Diagnosis not present

## 2023-11-02 LAB — HM MAMMOGRAPHY

## 2023-11-04 ENCOUNTER — Encounter: Payer: Self-pay | Admitting: Family Medicine

## 2023-11-10 DIAGNOSIS — M5416 Radiculopathy, lumbar region: Secondary | ICD-10-CM | POA: Diagnosis not present

## 2024-01-04 ENCOUNTER — Encounter: Payer: Self-pay | Admitting: Family Medicine

## 2024-01-04 ENCOUNTER — Ambulatory Visit: Payer: Medicare PPO | Admitting: Family Medicine

## 2024-01-04 ENCOUNTER — Ambulatory Visit (INDEPENDENT_AMBULATORY_CARE_PROVIDER_SITE_OTHER): Payer: Medicare PPO

## 2024-01-04 VITALS — BP 131/70 | HR 60 | Temp 97.9°F | Ht 66.0 in | Wt 177.6 lb

## 2024-01-04 DIAGNOSIS — M8588 Other specified disorders of bone density and structure, other site: Secondary | ICD-10-CM

## 2024-01-04 DIAGNOSIS — F439 Reaction to severe stress, unspecified: Secondary | ICD-10-CM | POA: Diagnosis not present

## 2024-01-04 MED ORDER — ALBUTEROL SULFATE HFA 108 (90 BASE) MCG/ACT IN AERS: 1.0000 | INHALATION_SPRAY | RESPIRATORY_TRACT | 1 refills | Status: AC | PRN

## 2024-01-04 NOTE — Patient Instructions (Signed)

## 2024-01-04 NOTE — Progress Notes (Signed)
 Subjective: CC: Stress PCP: Jolinda Norene HERO, DO Misty Gomez is a 68 y.o. female presenting to clinic today for:  1.  Stress Patient reports increase stress at home due to her husband, who suffers from OCD and generalized anxiety disorder.  He came off all of his medications a few years ago and has just gotten gradually worse with his anxious tendencies and mood lability.  She is encouraged him to go back on his medication because he is now causing her stress.  She cites that they recently had some family come into town and things were just awful.  She is continue to try and exercise and lead a healthy lifestyle so that she can keep herself in good health but she almost feels like she might need to go on medication due to him.  She is really trying to avoid this.  She is not seeking counseling but would absolutely be interested in that.   ROS: Per HPI  Allergies  Allergen Reactions   Relafen [Nabumetone] Rash    All over   Past Medical History:  Diagnosis Date   Anemia    Arthritis    Asthma    GERD (gastroesophageal reflux disease)    Hypothyroidism    Thyroid  eye disease 2017   Vitamin D  deficiency     Current Outpatient Medications:    acetaminophen  (TYLENOL ) 500 MG tablet, , Disp: , Rfl:    albuterol  (VENTOLIN  HFA) 108 (90 Base) MCG/ACT inhaler, Inhale 1-2 puffs into the lungs every 4 (four) hours as needed for wheezing or shortness of breath., Disp: 1 each, Rfl: 0   cetirizine  (ZYRTEC ) 10 MG tablet, Take 1 tablet (10 mg total) by mouth daily., Disp: 30 tablet, Rfl: 11   levothyroxine  (SYNTHROID ) 125 MCG tablet, Take 1 tablet (125 mcg total) by mouth daily., Disp: 90 tablet, Rfl: 3   montelukast  (SINGULAIR ) 10 MG tablet, Take 1 tablet (10 mg total) by mouth at bedtime., Disp: 90 tablet, Rfl: 3   omeprazole (PRILOSEC) 20 MG capsule, Take 20 mg by mouth daily., Disp: , Rfl:  Social History   Socioeconomic History   Marital status: Married    Spouse name: Not on  file   Number of children: 0   Years of education: Not on file   Highest education level: Not on file  Occupational History   Not on file  Tobacco Use   Smoking status: Never    Passive exposure: Never   Smokeless tobacco: Never  Vaping Use   Vaping status: Never Used  Substance and Sexual Activity   Alcohol use: No   Drug use: No   Sexual activity: Not Currently  Other Topics Concern   Not on file  Social History Narrative   Ms. 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   She walks several times weekly.   Social Drivers of Corporate investment banker Strain: Low Risk  (07/11/2023)   Overall Financial Resource Strain (CARDIA)    Difficulty of Paying Living Expenses: Not hard at all  Food Insecurity: No Food Insecurity (07/11/2023)   Hunger Vital Sign    Worried About Running Out of Food in the Last Year: Never true    Ran Out of Food in the Last Year: Never true  Transportation Needs: No Transportation Needs (07/11/2023)   PRAPARE - Administrator, Civil Service (Medical): No    Lack of Transportation (  Non-Medical): No  Physical Activity: Sufficiently Active (07/11/2023)   Exercise Vital Sign    Days of Exercise per Week: 7 days    Minutes of Exercise per Session: 60 min  Stress: No Stress Concern Present (07/11/2023)   [employer] of Occupational Health - Occupational Stress Questionnaire    Feeling of Stress : Only a little  Social Connections: Socially Integrated (07/11/2023)   Social Connection and Isolation Panel    Frequency of Communication with Friends and Family: More than three times a week    Frequency of Social Gatherings with Friends and Family: Three times a week    Attends Religious Services: 1 to 4 times per year    Active Member of Clubs or Organizations: Yes    Attends Banker Meetings: More than 4 times per year    Marital Status: Married  Catering manager  Violence: Not At Risk (07/11/2023)   Humiliation, Afraid, Rape, and Kick questionnaire    Fear of Current or Ex-Partner: No    Emotionally Abused: No    Physically Abused: No    Sexually Abused: No   Family History  Problem Relation Age of Onset   CVA Mother 22   Rheum arthritis Mother    Heart attack Mother 20   Heart disease Mother    Heart attack Father 23       had first heart attack in early 40s   Heart disease Father    Cancer Brother    Heart disease Brother    Cancer Brother    Diabetes Brother    Hypertension Brother    Pancreatic disease Maternal Grandmother    Diabetes Maternal Grandmother    Asthma Maternal Grandfather    Heart attack Maternal Grandfather    Heart attack Paternal Grandmother    Heart attack Paternal Grandfather     Objective: Office vital signs reviewed. BP 131/70   Pulse 60   Temp 97.9 F (36.6 C)   Ht 5' 6 (1.676 m)   Wt 177 lb 9.6 oz (80.6 kg)   SpO2 97%   BMI 28.67 kg/m   Physical Examination:  General: Awake, alert, well nourished, No acute distress HEENT: sclera white, MMM Cardio: regular rate and rhythm, S1S2 heard, no murmurs appreciated Pulm: clear to auscultation bilaterally, no wheezes, rhonchi or rales; normal work of breathing on room air    01/04/2024    8:20 AM 07/11/2023    4:50 PM 07/06/2023    8:31 AM  Depression screen PHQ 2/9  Decreased Interest 0 0 0  Down, Depressed, Hopeless 0 0 0  PHQ - 2 Score 0 0 0  Altered sleeping 0 0 0  Tired, decreased energy 0 0 0  Change in appetite 0 0 0  Feeling bad or failure about yourself  0 0 0  Trouble concentrating 0 0 0  Moving slowly or fidgety/restless 0 0 0  Suicidal thoughts 0 0 0  PHQ-9 Score 0 0 0  Difficult doing work/chores Not difficult at all Not difficult at all Not difficult at all      01/04/2024    8:19 AM 07/06/2023    8:31 AM 04/14/2022   10:31 AM 04/01/2021    9:07 AM  GAD 7 : Generalized Anxiety Score  Nervous, Anxious, on Edge 0 0 0 0   Control/stop worrying 0 0 0 0  Worry too much - different things 0 0 1 0  Trouble relaxing 0 0 1 0  Restless 0 0 1  0  Easily annoyed or irritable 0 1 0 1  Afraid - awful might happen 0 0 0 0  Total GAD 7 Score 0 1 3 1   Anxiety Difficulty Not difficult at all Not difficult at all Somewhat difficult Not difficult at all     Assessment/ Plan: 68 y.o. female   Stress at home - Plan: Ambulatory referral to Integrated Behavioral Health  Osteopenia of lumbar spine  Patient and/or legal guardian verbally consented to [facility] services about presenting concerns and psychiatric consultation as appropriate.  The services will be billed as appropriate for the patient  Referral to integrative behavioral health here in office for counseling services.  She will contact me if she decides she does want to go on medications and we can consider something like an SSRI.  She is getting her DEXA scan done for follow-up of osteopenia of the lumbar spine.  Norene CHRISTELLA Fielding, DO [facility] [phone]

## 2024-01-11 DIAGNOSIS — Z780 Asymptomatic menopausal state: Secondary | ICD-10-CM | POA: Diagnosis not present

## 2024-01-11 DIAGNOSIS — M8589 Other specified disorders of bone density and structure, multiple sites: Secondary | ICD-10-CM | POA: Diagnosis not present

## 2024-01-13 ENCOUNTER — Ambulatory Visit: Payer: Self-pay | Admitting: Family Medicine

## 2024-01-16 DIAGNOSIS — D225 Melanocytic nevi of trunk: Secondary | ICD-10-CM | POA: Diagnosis not present

## 2024-01-16 DIAGNOSIS — Z1283 Encounter for screening for malignant neoplasm of skin: Secondary | ICD-10-CM | POA: Diagnosis not present

## 2024-01-16 DIAGNOSIS — L570 Actinic keratosis: Secondary | ICD-10-CM | POA: Diagnosis not present

## 2024-01-16 DIAGNOSIS — X32XXXD Exposure to sunlight, subsequent encounter: Secondary | ICD-10-CM | POA: Diagnosis not present

## 2024-02-07 ENCOUNTER — Ambulatory Visit (INDEPENDENT_AMBULATORY_CARE_PROVIDER_SITE_OTHER): Admitting: Professional Counselor

## 2024-02-07 DIAGNOSIS — F439 Reaction to severe stress, unspecified: Secondary | ICD-10-CM
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
## 2024-02-08 ENCOUNTER — Telehealth (INDEPENDENT_AMBULATORY_CARE_PROVIDER_SITE_OTHER): Payer: Self-pay | Admitting: Professional Counselor

## 2024-02-08 DIAGNOSIS — F439 Reaction to severe stress, unspecified: Secondary | ICD-10-CM

## 2024-02-08 NOTE — Patient Instructions (Signed)
 If your symptoms worsen or you have thoughts of suicide/homicide, PLEASE SEEK IMMEDIATE MEDICAL ATTENTION.  You may always call:   National Suicide Hotline: 988 or 539 667 3577 [place] Crisis Line: 458 569 7915 Crisis Recovery in [place]: [phone]     These are available 24 hours a day, 7 days a week.

## 2024-02-08 NOTE — BH Specialist Note (Signed)
 Virtual Behavioral Health Treatment Plan Team Note  MRN: [id number] NAME: SHAWNTRICE SALLE  DATE: 02/08/24  Start time: Start Time: 0330 End time: Stop Time: 0335 Total time: Total Time in Minutes (Visit): 5  Total number of Virtual BH Treatment Team Plan encounters: 1/4  Treatment Team Attendees: Sharlot Becker and Redell Corn  Diagnoses:    ICD-10-CM   1. Stress at home  F43.9       Goals, Interventions and Follow-up Plan Goals: Increase adequate support systems for patient/family Increase motivation to adhere to plan of care Interventions: Mindfulness or Relaxation Training CBT Cognitive Behavioral Therapy Medication Management Recommendations: NA Follow-up Plan:  Refer to marital therapy  History of the present illness Presenting Problem/Current Symptoms:  Patient is a 68 year old female presenting for a collaborative care assessment.   She presents with a PHQ-9 score of 3 and a GAD-7 score of 0, reflecting minimal symptoms. Despite the low screening scores, her primary care physician recommended evaluation due to current life stressors.   The patient reports experiencing mild anxiety and depressive symptoms primarily related to her current stage of life. She is retired, as is her husband, and she describes feeling "cooped up" at times. She reports differences in personality and lifestyle preferences with her husband--she desires to engage in more activities, whereas she feels he tends to be more rigid and detail-focused, at times displaying obsessive tendencies. This dynamic has led her to feel somewhat trapped and disconnected within the relationship.   She identifies goals of improving communication skills, learning how to prioritize her own needs, and becoming more comfortable with setting boundaries and saying "no." She expresses interest in therapy as a means of support and growth. She is not currently prescribed or taking any psychiatric medications and does not feel she  requires them at this time.  Screenings PHQ-9 Assessments:     02/07/2024   11:12 AM 01/04/2024    8:20 AM 07/11/2023    4:50 PM  Depression screen PHQ 2/9  Decreased Interest 0 0 0  Down, Depressed, Hopeless 0 0 0  PHQ - 2 Score 0 0 0  Altered sleeping 1 0 0  Tired, decreased energy 1 0 0  Change in appetite 0 0 0  Feeling bad or failure about yourself  1 0 0  Trouble concentrating 0 0 0  Moving slowly or fidgety/restless 0 0 0  Suicidal thoughts 0 0 0  PHQ-9 Score 3 0 0  Difficult doing work/chores  Not difficult at all Not difficult at all   GAD-7 Assessments:     02/07/2024   11:13 AM 01/04/2024    8:19 AM 07/06/2023    8:31 AM 04/14/2022   10:31 AM  GAD 7 : Generalized Anxiety Score  Nervous, Anxious, on Edge 0 0 0 0  Control/stop worrying 0 0 0 0  Worry too much - different things 0 0 0 1  Trouble relaxing 0 0 0 1  Restless 0 0 0 1  Easily annoyed or irritable 0 0 1 0  Afraid - awful might happen 0 0 0 0  Total GAD 7 Score 0 0 1 3  Anxiety Difficulty Somewhat difficult Not difficult at all Not difficult at all Somewhat difficult    Past Medical History Past Medical History:  Diagnosis Date   Anemia    Arthritis    Asthma    GERD (gastroesophageal reflux disease)    Hypothyroidism    Thyroid  eye disease 2017   Vitamin D  deficiency  Vital signs: There were no vitals filed for this visit.  Allergies:  Allergies as of 02/08/2024 - Review Complete 01/04/2024  Allergen Reaction Noted   Relafen [nabumetone] Rash 01/04/2017    Medication History Current medications:  Outpatient Encounter Medications as of 02/08/2024  Medication Sig   acetaminophen  (TYLENOL ) 500 MG tablet    albuterol  (VENTOLIN  HFA) 108 (90 Base) MCG/ACT inhaler Inhale 1-2 puffs into the lungs every 4 (four) hours as needed for wheezing or shortness of breath.   cetirizine  (ZYRTEC ) 10 MG tablet Take 1 tablet (10 mg total) by mouth daily.   levothyroxine  (SYNTHROID ) 125 MCG tablet Take 1  tablet (125 mcg total) by mouth daily.   montelukast  (SINGULAIR ) 10 MG tablet Take 1 tablet (10 mg total) by mouth at bedtime.   omeprazole (PRILOSEC) 20 MG capsule Take 20 mg by mouth daily.   No facility-administered encounter medications on file as of 02/08/2024.     Scribe for Treatment Team: Redell JINNY Corn

## 2024-02-21 ENCOUNTER — Ambulatory Visit: Admitting: Professional Counselor

## 2024-02-21 DIAGNOSIS — F439 Reaction to severe stress, unspecified: Secondary | ICD-10-CM

## 2024-02-21 NOTE — BH Specialist Note (Signed)
 [facility] Virtual BH Telephone Follow-up  MRN: [id number] NAME: Misty Gomez Date: 02/28/24  Start time: Start Time: 0200 End time: Stop Time: 0230 Total time: Total Time in Minutes (Visit): 30 Call number: Visit Number: 3- Third Visit  Reason for call today:  Patient is a 68 year old female seen for a collaborative care follow-up. She presented in a positive mood and reported that her situation is improving. She has been utilizing strategies discussed in the previous visit, including practicing more assertive communication with her husband, focusing on self-care, and increasing her activity level. She noted that these changes have given her a boost in overall well-being.  While the patient continues to experience some stressors and occasional difficult days, she reports overall improvement. PHQ-9 and GAD-7 scores continue to decrease. Plan is to transition the patient to traditional therapy for ongoing support, but for now collaborative care will continue to provide brief interventions and additional coping skills to practice.  Overall, the patient expressed a very positive impression of collaborative care and voiced appreciation for the support. Follow-up is scheduled in two weeks.   PHQ-9 Scores:     02/21/2024    2:08 PM 02/07/2024   11:12 AM 01/04/2024    8:20 AM 07/11/2023    4:50 PM 07/06/2023    8:31 AM  Depression screen PHQ 2/9  Decreased Interest 0 0 0 0 0  Down, Depressed, Hopeless 0 0 0 0 0  PHQ - 2 Score 0 0 0 0 0  Altered sleeping 1 1 0 0 0  Tired, decreased energy 0 1 0 0 0  Change in appetite 0 0 0 0 0  Feeling bad or failure about yourself  0 1 0 0 0  Trouble concentrating 0 0 0 0 0  Moving slowly or fidgety/restless 0 0 0 0 0  Suicidal thoughts 0 0 0 0 0  PHQ-9 Score 1 3 0 0 0  Difficult doing work/chores   Not difficult at all Not difficult at all Not difficult at all   GAD-7 Scores:     02/21/2024    2:09 PM 02/07/2024   11:13 AM 01/04/2024    8:19 AM  07/06/2023    8:31 AM  GAD 7 : Generalized Anxiety Score  Nervous, Anxious, on Edge 0 0 0 0  Control/stop worrying 0 0 0 0  Worry too much - different things 0 0 0 0  Trouble relaxing 1 0 0 0  Restless 0 0 0 0  Easily annoyed or irritable 1 0 0 1  Afraid - awful might happen 0 0 0 0  Total GAD 7 Score 2 0 0 1  Anxiety Difficulty Not difficult at all Somewhat difficult Not difficult at all Not difficult at all    Stress Current stressors:  Relationship Sleep:  Good Appetite:  Good Coping ability:  Good Patient taking medications as prescribed:  NA  Current medications:  Outpatient Encounter Medications as of 02/21/2024  Medication Sig   acetaminophen  (TYLENOL ) 500 MG tablet    albuterol  (VENTOLIN  HFA) 108 (90 Base) MCG/ACT inhaler Inhale 1-2 puffs into the lungs every 4 (four) hours as needed for wheezing or shortness of breath.   cetirizine  (ZYRTEC ) 10 MG tablet Take 1 tablet (10 mg total) by mouth daily.   levothyroxine  (SYNTHROID ) 125 MCG tablet Take 1 tablet (125 mcg total) by mouth daily.   montelukast  (SINGULAIR ) 10 MG tablet Take 1 tablet (10 mg total) by mouth at bedtime.   omeprazole (PRILOSEC) 20 MG  capsule Take 20 mg by mouth daily.   No facility-administered encounter medications on file as of 02/21/2024.     Self-harm Behaviors Risk Assessment Self-harm risk factors:  None Patient endorses recent thoughts of harming self:  Denies   Danger to Others Risk Assessment Danger to others risk factors:  None Patient endorses recent thoughts of harming others:  Denies   Substance Use Assessment Patient recently consumed alcohol:  Denies  Alcohol Use Disorder Identification Test (AUDIT):     07/05/2022    2:11 PM 07/06/2023    8:34 AM 07/11/2023    4:50 PM  Alcohol Use Disorder Test (AUDIT)  1. How often do you have a drink containing alcohol? 0 0 0  2. How many drinks containing alcohol do you have on a typical day when you are drinking? 0 0 0  3. How often do you have  six or more drinks on one occasion? 0 0 0  AUDIT-C Score 0 0 0    Goals, Interventions and Follow-up Plan Goals: Increase healthy adjustment to current life circumstances Interventions: CBT Cognitive Behavioral Therapy Follow-up Plan: Refer to [facility] Outpatient Therapy   Misty Gomez

## 2024-03-06 ENCOUNTER — Ambulatory Visit: Admitting: Professional Counselor

## 2024-04-17 ENCOUNTER — Encounter: Payer: Self-pay | Admitting: Family Medicine

## 2024-04-18 NOTE — Telephone Encounter (Signed)
 If she can do a virtual visit today at lunch, ok to double book her into my schedule. Otherwise, please offer an in person visit in on the my SD slots Friday.

## 2024-04-20 ENCOUNTER — Encounter: Payer: Self-pay | Admitting: Family Medicine

## 2024-04-20 ENCOUNTER — Ambulatory Visit: Admitting: Family Medicine

## 2024-04-20 VITALS — BP 136/76 | HR 53 | Temp 97.9°F | Ht 66.0 in | Wt 174.0 lb

## 2024-04-20 DIAGNOSIS — F329 Major depressive disorder, single episode, unspecified: Secondary | ICD-10-CM

## 2024-04-20 DIAGNOSIS — E890 Postprocedural hypothyroidism: Secondary | ICD-10-CM

## 2024-04-20 DIAGNOSIS — F439 Reaction to severe stress, unspecified: Secondary | ICD-10-CM

## 2024-04-20 MED ORDER — MIRTAZAPINE 7.5 MG PO TABS: 7.5000 mg | ORAL_TABLET | Freq: Every day | ORAL | 1 refills | Status: DC

## 2024-04-20 NOTE — Progress Notes (Signed)
 Subjective: CC: Depression PCP: Jolinda Norene HERO, DO YEP:Gnbrz D Venezia is a 68 y.o. female presenting to clinic today for:  Patient presents today because she has been having increasing depressive mood over the last 2 months but is had some level of depressive and stress symptoms over the last year.  Initially she attributed this to her husband's health but notes that they have actually been getting along really well lately and so she is really not sure why she has been feeling poorly.  She admits that her thyroid  tablet looks different but otherwise feels euthymic.  She feels like she has a great life and therefore she is not sure why she is so tearful.  Sleep is variable.  Sometimes she has difficulty going to sleep but sometimes she goes to sleep just fine but wakes up frequently throughout the night.  No SI or HI.  She feels safe at home.  Has seen the counselor.   ROS: Per HPI  Allergies  Allergen Reactions   Relafen [Nabumetone] Rash    All over   Past Medical History:  Diagnosis Date   Anemia    Arthritis    Asthma    GERD (gastroesophageal reflux disease)    Hypothyroidism    Thyroid  eye disease 2017   Vitamin D  deficiency     Current Outpatient Medications:    acetaminophen  (TYLENOL ) 500 MG tablet, , Disp: , Rfl:    albuterol  (VENTOLIN  HFA) 108 (90 Base) MCG/ACT inhaler, Inhale 1-2 puffs into the lungs every 4 (four) hours as needed for wheezing or shortness of breath., Disp: 18 g, Rfl: 1   cetirizine  (ZYRTEC ) 10 MG tablet, Take 1 tablet (10 mg total) by mouth daily., Disp: 30 tablet, Rfl: 11   levothyroxine  (SYNTHROID ) 125 MCG tablet, Take 1 tablet (125 mcg total) by mouth daily., Disp: 90 tablet, Rfl: 3   mirtazapine (REMERON) 7.5 MG tablet, Take 1 tablet (7.5 mg total) by mouth at bedtime., Disp: 30 tablet, Rfl: 1   montelukast  (SINGULAIR ) 10 MG tablet, Take 1 tablet (10 mg total) by mouth at bedtime., Disp: 90 tablet, Rfl: 3   omeprazole (PRILOSEC) 20 MG  capsule, Take 20 mg by mouth daily., Disp: , Rfl:  Social History   Socioeconomic History   Marital status: Married    Spouse name: Not on file   Number of children: 0   Years of education: Not on file   Highest education level: Not on file  Occupational History   Not on file  Tobacco Use   Smoking status: Never    Passive exposure: Never   Smokeless tobacco: Never  Vaping Use   Vaping status: Never Used  Substance and Sexual Activity   Alcohol use: No   Drug use: No   Sexual activity: Not Currently  Other Topics Concern   Not on file  Social History Narrative   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   She walks several times weekly.   Social Drivers of Corporate Investment Banker Strain: Low Risk  (07/11/2023)   Overall Financial Resource Strain (CARDIA)    Difficulty of Paying Living Expenses: Not hard at all  Food Insecurity: No Food Insecurity (07/11/2023)   Hunger Vital Sign    Worried About Running Out of Food in the Last Year: Never true    Ran Out of Food in the Last Year: Never true  Transportation Needs:  No Transportation Needs (07/11/2023)   PRAPARE - Administrator, Civil Service (Medical): No    Lack of Transportation (Non-Medical): No  Physical Activity: Sufficiently Active (07/11/2023)   Exercise Vital Sign    Days of Exercise per Week: 7 days    Minutes of Exercise per Session: 60 min  Stress: No Stress Concern Present (07/11/2023)   [employer] of Occupational Health - Occupational Stress Questionnaire    Feeling of Stress : Only a little  Social Connections: Socially Integrated (07/11/2023)   Social Connection and Isolation Panel    Frequency of Communication with Friends and Family: More than three times a week    Frequency of Social Gatherings with Friends and Family: Three times a week    Attends Religious Services: 1 to 4 times per year    Active Member of Clubs or  Organizations: Yes    Attends Banker Meetings: More than 4 times per year    Marital Status: Married  Catering Manager Violence: Not At Risk (07/11/2023)   Humiliation, Afraid, Rape, and Kick questionnaire    Fear of Current or Ex-Partner: No    Emotionally Abused: No    Physically Abused: No    Sexually Abused: No   Family History  Problem Relation Age of Onset   CVA Mother 57   Rheum arthritis Mother    Heart attack Mother 56   Heart disease Mother    Heart attack Father 14       had first heart attack in early 35s   Heart disease Father    Cancer Brother    Heart disease Brother    Cancer Brother    Diabetes Brother    Hypertension Brother    Pancreatic disease Maternal Grandmother    Diabetes Maternal Grandmother    Asthma Maternal Grandfather    Heart attack Maternal Grandfather    Heart attack Paternal Grandmother    Heart attack Paternal Grandfather     Objective: Office vital signs reviewed. BP 136/76   Pulse (!) 53   Temp 97.9 F (36.6 C)   Ht 5' 6 (1.676 m)   Wt 174 lb (78.9 kg)   SpO2 95%   BMI 28.08 kg/m   Physical Examination:  General: Awake, alert, well nourished Psych: Tearful.  Thought process linear.  Speech normal.  Good eye contact.  Does not appear to be responding to internal stimuli     04/20/2024    3:40 PM 02/21/2024    2:08 PM 02/07/2024   11:12 AM  Depression screen PHQ 2/9  Decreased Interest 0 0 0  Down, Depressed, Hopeless 1 0 0  PHQ - 2 Score 1 0 0  Altered sleeping 1 1 1   Tired, decreased energy 1 0 1  Change in appetite 0 0 0  Feeling bad or failure about yourself  1 0 1  Trouble concentrating 0 0 0  Moving slowly or fidgety/restless 0 0 0  Suicidal thoughts 0 0 0  PHQ-9 Score 4 1  3       Data saved with a previous flowsheet row definition      04/20/2024    3:40 PM 02/21/2024    2:09 PM 02/07/2024   11:13 AM 01/04/2024    8:19 AM  GAD 7 : Generalized Anxiety Score  Nervous, Anxious, on Edge 1 0 0 0   Control/stop worrying 0 0 0 0  Worry too much - different things 0 0 0 0  Trouble relaxing  0 1 0 0  Restless 0 0 0 0  Easily annoyed or irritable 1 1 0 0  Afraid - awful might happen 0 0 0 0  Total GAD 7 Score 2 2 0 0  Anxiety Difficulty Not difficult at all Not difficult at all Somewhat difficult Not difficult at all    Assessment/ Plan: 68 y.o. female   Stress at home - Plan: mirtazapine (REMERON) 7.5 MG tablet  Reactive depression - Plan: mirtazapine (REMERON) 7.5 MG tablet  Postablative hypothyroidism - Plan: TSH + free T4   Trial her on low-dose mirtazapine at bedtime.  She has seen in [employer] [facility] and we may consider having her see him back again.  Will reassess her closely in 4 weeks but the meantime would like to go ahead and check thyroid  levels to make sure that she is not having hypothyroid levels causing the depressive symptoms, particular given change in manufacturer recently.   Norene CHRISTELLA Fielding, DO [facility] [phone]

## 2024-04-20 NOTE — Patient Instructions (Signed)
Taking the medicine as directed and not missing any doses is one of the best things you can do to treat your depression.  Here are some things to keep in mind:  Side effects (stomach upset, some increased anxiety) may happen before you notice a benefit.  These side effects typically go away over time. Changes to your dose of medicine or a change in medication all together is sometimes necessary Most people need to be on medication at least 12 months Many people will notice an improvement within two weeks but the full effect of the medication can take up to 4-6 weeks Stopping the medication when you start feeling better often results in a return of symptoms Never discontinue your medication without contacting a health care professional first.  Some medications require gradual discontinuation/ taper and can make you sick if you stop them abruptly.  If your symptoms worsen or you have thoughts of suicide/homicide, PLEASE SEEK IMMEDIATE MEDICAL ATTENTION.  You may always call:  National Suicide Hotline: 7542058722 [place]: [phone] [facility] in [place]: 2548116572   These are available 24 hours a day, 7 days a week.

## 2024-04-21 LAB — TSH+FREE T4
Free T4: 1.23 ng/dL (ref 0.82–1.77)
TSH: 1.32 u[IU]/mL (ref 0.450–4.500)

## 2024-04-23 ENCOUNTER — Ambulatory Visit: Payer: Self-pay | Admitting: Family Medicine

## 2024-04-23 ENCOUNTER — Encounter: Payer: Self-pay | Admitting: Family Medicine

## 2024-05-12 ENCOUNTER — Other Ambulatory Visit: Payer: Self-pay | Admitting: Family Medicine

## 2024-05-12 DIAGNOSIS — F439 Reaction to severe stress, unspecified: Secondary | ICD-10-CM

## 2024-05-12 DIAGNOSIS — F329 Major depressive disorder, single episode, unspecified: Secondary | ICD-10-CM

## 2024-05-22 ENCOUNTER — Ambulatory Visit: Admitting: Family Medicine

## 2024-05-22 ENCOUNTER — Encounter: Payer: Self-pay | Admitting: Family Medicine

## 2024-05-22 VITALS — BP 134/72 | HR 59 | Temp 97.8°F | Ht 66.0 in | Wt 175.0 lb

## 2024-05-22 DIAGNOSIS — F329 Major depressive disorder, single episode, unspecified: Secondary | ICD-10-CM

## 2024-05-22 DIAGNOSIS — F439 Reaction to severe stress, unspecified: Secondary | ICD-10-CM

## 2024-05-22 DIAGNOSIS — E890 Postprocedural hypothyroidism: Secondary | ICD-10-CM

## 2024-05-22 MED ORDER — LEVOTHYROXINE SODIUM 125 MCG PO TABS: 125.0000 ug | ORAL_TABLET | Freq: Every day | ORAL | 3 refills | Status: AC

## 2024-05-22 MED ORDER — MIRTAZAPINE 7.5 MG PO TABS: 7.5000 mg | ORAL_TABLET | Freq: Every day | ORAL | 3 refills | Status: AC

## 2024-05-22 NOTE — Progress Notes (Signed)
 Subjective: CC: Follow-up stress, depression PCP: Jolinda Misty HERO, DO Misty Gomez is a 68 y.o. female presenting to clinic today for:  Patient was seen 1 month ago for reactive depression and increased stress at home.  She started mirtazapine  7.5 mg at bedtime and she reports that this has been a miracle drug for her.  She is sleeping well. No hangover.  Mood has been stable. She notes family has noticed a positive improvement in her demeanor.  She feels good and denies any adverse side effects.  Wishes to continue medication.   ROS: Per HPI  Allergies  Allergen Reactions   Relafen [Nabumetone] Rash    All over   Past Medical History:  Diagnosis Date   Anemia    Arthritis    Asthma    GERD (gastroesophageal reflux disease)    Hypothyroidism    Thyroid  eye disease 2017   Vitamin D  deficiency     Current Outpatient Medications:    acetaminophen  (TYLENOL ) 500 MG tablet, , Disp: , Rfl:    albuterol  (VENTOLIN  HFA) 108 (90 Base) MCG/ACT inhaler, Inhale 1-2 puffs into the lungs every 4 (four) hours as needed for wheezing or shortness of breath., Disp: 18 g, Rfl: 1   cetirizine  (ZYRTEC ) 10 MG tablet, Take 1 tablet (10 mg total) by mouth daily., Disp: 30 tablet, Rfl: 11   levothyroxine  (SYNTHROID ) 125 MCG tablet, Take 1 tablet (125 mcg total) by mouth daily., Disp: 90 tablet, Rfl: 3   mirtazapine  (REMERON ) 7.5 MG tablet, TAKE 1 TABLET BY MOUTH AT BEDTIME., Disp: 90 tablet, Rfl: 0   montelukast  (SINGULAIR ) 10 MG tablet, Take 1 tablet (10 mg total) by mouth at bedtime., Disp: 90 tablet, Rfl: 3   omeprazole (PRILOSEC) 20 MG capsule, Take 20 mg by mouth daily., Disp: , Rfl:  Social History   Socioeconomic History   Marital status: Married    Spouse name: Not on file   Number of children: 0   Years of education: Not on file   Highest education level: Not on file  Occupational History   Not on file  Tobacco Use   Smoking status: Never    Passive exposure: Never    Smokeless tobacco: Never  Vaping Use   Vaping status: Never Used  Substance and Sexual Activity   Alcohol use: No   Drug use: No   Sexual activity: Not Currently  Other Topics Concern   Not on file  Social History Narrative   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   She walks several times weekly.   Social Drivers of Corporate Investment Banker Strain: Low Risk  (07/11/2023)   Overall Financial Resource Strain (CARDIA)    Difficulty of Paying Living Expenses: Not hard at all  Food Insecurity: No Food Insecurity (07/11/2023)   Hunger Vital Sign    Worried About Running Out of Food in the Last Year: Never true    Ran Out of Food in the Last Year: Never true  Transportation Needs: No Transportation Needs (07/11/2023)   PRAPARE - Administrator, Civil Service (Medical): No    Lack of Transportation (Non-Medical): No  Physical Activity: Sufficiently Active (07/11/2023)   Exercise Vital Sign    Days of Exercise per Week: 7 days    Minutes of Exercise per Session: 60 min  Stress: No Stress Concern Present (07/11/2023)   [employer] of Occupational  Health - Occupational Stress Questionnaire    Feeling of Stress : Only a little  Social Connections: Socially Integrated (07/11/2023)   Social Connection and Isolation Panel    Frequency of Communication with Friends and Family: More than three times a week    Frequency of Social Gatherings with Friends and Family: Three times a week    Attends Religious Services: 1 to 4 times per year    Active Member of Clubs or Organizations: Yes    Attends Banker Meetings: More than 4 times per year    Marital Status: Married  Catering Manager Violence: Not At Risk (07/11/2023)   Humiliation, Afraid, Rape, and Kick questionnaire    Fear of Current or Ex-Partner: No    Emotionally Abused: No    Physically Abused: No    Sexually Abused: No   Family History   Problem Relation Age of Onset   CVA Mother 80   Rheum arthritis Mother    Heart attack Mother 42   Heart disease Mother    Heart attack Father 2       had first heart attack in early 15s   Heart disease Father    Cancer Brother    Heart disease Brother    Cancer Brother    Diabetes Brother    Hypertension Brother    Pancreatic disease Maternal Grandmother    Diabetes Maternal Grandmother    Asthma Maternal Grandfather    Heart attack Maternal Grandfather    Heart attack Paternal Grandmother    Heart attack Paternal Grandfather     Objective: Office vital signs reviewed. BP 134/72   Pulse (!) 59   Temp 97.8 F (36.6 C)   Ht 5' 6 (1.676 m)   Wt 175 lb (79.4 kg)   SpO2 95%   BMI 28.25 kg/m   Physical Examination:  General: Awake, alert, well nourished, No acute distress HEENT: sclera white, MMM Cardio: regular rate   Pulm: normal work of breathing on room air Psych: mood stable, happy. Good eye contact. Normal speech     05/22/2024    3:43 PM 04/20/2024    3:40 PM 02/21/2024    2:08 PM  Depression screen PHQ 2/9  Decreased Interest 0 0 0  Down, Depressed, Hopeless 0 1 0  PHQ - 2 Score 0 1 0  Altered sleeping 0 1 1  Tired, decreased energy 0 1 0  Change in appetite 0 0 0  Feeling bad or failure about yourself  0 1 0  Trouble concentrating 0 0 0  Moving slowly or fidgety/restless 0 0 0  Suicidal thoughts 0 0 0  PHQ-9 Score 0 4 1   Difficult doing work/chores Not difficult at all       Data saved with a previous flowsheet row definition      05/22/2024    3:44 PM 04/20/2024    3:40 PM 02/21/2024    2:09 PM 02/07/2024   11:13 AM  GAD 7 : Generalized Anxiety Score  Nervous, Anxious, on Edge 0 1 0 0  Control/stop worrying 0 0 0 0  Worry too much - different things 0 0 0 0  Trouble relaxing 0 0 1 0  Restless 0 0 0 0  Easily annoyed or irritable 0 1 1 0  Afraid - awful might happen 0 0 0 0  Total GAD 7 Score 0 2 2 0  Anxiety Difficulty Not difficult at all  Not difficult at all Not difficult at all  Somewhat difficult      Assessment/ Plan: 68 y.o. female   Stress at home - Plan: mirtazapine  (REMERON ) 7.5 MG tablet  Reactive depression - Plan: mirtazapine  (REMERON ) 7.5 MG tablet  Postablative hypothyroidism - Plan: levothyroxine  (SYNTHROID ) 125 MCG tablet   Well controlled on mirtazapine .    TSH was normal.  Refills sent.  Follow up in July for CPE     Misty CHRISTELLA Fielding, DO [facility] [phone]

## 2024-07-11 ENCOUNTER — Ambulatory Visit: Payer: Medicare PPO

## 2024-07-11 VITALS — BP 134/72 | HR 59 | Ht 66.0 in | Wt 175.0 lb

## 2024-07-11 DIAGNOSIS — Z0000 Encounter for general adult medical examination without abnormal findings: Secondary | ICD-10-CM

## 2024-07-11 NOTE — Progress Notes (Signed)
 "  Chief Complaint  Patient presents with   Medicare Wellness     Subjective:   Misty Gomez is a 69 y.o. female who presents for a Medicare Annual Wellness Visit.  Visit info / Clinical Intake: Medicare Wellness Visit Type:: Subsequent Annual Wellness Visit Persons participating in visit and providing information:: patient Medicare Wellness Visit Mode:: Telephone If telephone:: video declined Since this visit was completed virtually, some vitals may be partially provided or unavailable. Missing vitals are due to the limitations of the virtual format.: Unable to obtain vitals - no equipment If Telephone or Video please confirm:: I connected with patient using audio/video enable telemedicine. I verified patient identity with two identifiers, discussed telehealth limitations, and patient agreed to proceed. Patient Location:: home Provider Location:: office Interpreter Needed?: No Pre-visit prep was completed: yes AWV questionnaire completed by patient prior to visit?: no Living arrangements:: lives with spouse/significant other Patient's Overall Health Status Rating: very good Typical amount of pain: none Does pain affect daily life?: no Are you currently prescribed opioids?: no  Dietary Habits and Nutritional Risks How many meals a day?: 3 Eats fruit and vegetables daily?: yes Most meals are obtained by: preparing own meals In the last 2 weeks, have you had any of the following?: none Diabetic:: no  Functional Status Activities of Daily Living (to include ambulation/medication): Independent Ambulation: Independent Medication Administration: Independent Home Management (perform basic housework or laundry): Independent Manage your own finances?: yes Primary transportation is: driving Concerns about vision?: no *vision screening is required for [facility]* (last ov 2025) Concerns about hearing?: no  Fall Screening Falls in the past year?: 0 Number of falls in past year: 0 Was  there an injury with Fall?: 0 Fall Risk Category Calculator: 0 Patient Fall Risk Level: Low Fall Risk  Fall Risk Patient at Risk for Falls Due to: No Fall Risks Fall risk Follow up: Falls evaluation completed; Education provided  Home and Transportation Safety: All rugs have non-skid backing?: yes All stairs or steps have railings?: yes Grab bars in the bathtub or shower?: yes Have non-skid surface in bathtub or shower?: yes Good home lighting?: yes Regular seat belt use?: yes Hospital stays in the last year:: no  Cognitive Assessment Difficulty concentrating, remembering, or making decisions? : no Will 6CIT or Mini Cog be Completed: yes What year is it?: 0 points What month is it?: 0 points Give patient an address phrase to remember (5 components): [address] [place] About what time is it?: 0 points Count backwards from 20 to 1: 0 points Say the months of the year in reverse: 0 points Repeat the address phrase from earlier: 0 points 6 CIT Score: 0 points  Advance Directives (For Healthcare) Does Patient Have a Medical Advance Directive?: Yes Type of Advance Directive: Healthcare Power of [place]; Living will Copy of Healthcare Power of Attorney in Chart?: Yes - validated most recent copy scanned in chart (See row information) Copy of Living Will in Chart?: Yes - validated most recent copy scanned in chart (See row information)  Reviewed/Updated  Reviewed/Updated: Reviewed All (Medical, Surgical, Family, Medications, Allergies, Care Teams, Patient Goals)    Allergies (verified) Relafen [nabumetone]   Current Medications (verified) Outpatient Encounter Medications as of 07/11/2024  Medication Sig   acetaminophen  (TYLENOL ) 500 MG tablet    albuterol  (VENTOLIN  HFA) 108 (90 Base) MCG/ACT inhaler Inhale 1-2 puffs into the lungs every 4 (four) hours as needed for wheezing or shortness of breath.   cetirizine  (ZYRTEC ) 10  MG tablet Take 1 tablet (10 mg total) by  mouth daily.   levothyroxine  (SYNTHROID ) 125 MCG tablet Take 1 tablet (125 mcg total) by mouth daily.   mirtazapine  (REMERON ) 7.5 MG tablet Take 1 tablet (7.5 mg total) by mouth at bedtime.   montelukast  (SINGULAIR ) 10 MG tablet Take 1 tablet (10 mg total) by mouth at bedtime.   omeprazole (PRILOSEC) 20 MG capsule Take 20 mg by mouth daily.   No facility-administered encounter medications on file as of 07/11/2024.    History: Past Medical History:  Diagnosis Date   Anemia    Arthritis    Asthma    GERD (gastroesophageal reflux disease)    Hypothyroidism    Thyroid  eye disease 2017   Vitamin D  deficiency    Past Surgical History:  Procedure Laterality Date   BARTHOLIN GLAND CYST EXCISION     COLONOSCOPY N/A 01/06/2017   Procedure: COLONOSCOPY;  Surgeon: Golda Claudis PENNER, MD;  Location: [facility];  Service: Endoscopy;  Laterality: N/A;  930   POLYPECTOMY  01/06/2017   Procedure: POLYPECTOMY;  Surgeon: Golda Claudis PENNER, MD;  Location: [facility];  Service: Endoscopy;;  colon   Family History  Problem Relation Age of Onset   CVA Mother 44   Rheum arthritis Mother    Heart attack Mother 52   Heart disease Mother    Heart attack Father 93       had first heart attack in early 80s   Heart disease Father    Cancer Brother    Heart disease Brother    Cancer Brother    Diabetes Brother    Hypertension Brother    Pancreatic disease Maternal Grandmother    Diabetes Maternal Grandmother    Asthma Maternal Grandfather    Heart attack Maternal Grandfather    Heart attack Paternal Grandmother    Heart attack Paternal Grandfather    Social History   Occupational History   Not on file  Tobacco Use   Smoking status: Never    Passive exposure: Never   Smokeless tobacco: Never  Vaping Use   Vaping status: Never Used  Substance and Sexual Activity   Alcohol use: No   Drug use: No   Sexual activity: Not Currently   Tobacco Counseling Counseling given: Yes  SDOH  Screenings   Food Insecurity: No Food Insecurity (07/11/2024)  Housing: Unknown (07/11/2024)  Transportation Needs: No Transportation Needs (07/11/2024)  Utilities: Not At Risk (07/11/2024)  Alcohol Screen: Low Risk (07/11/2023)  Depression (PHQ2-9): Low Risk (07/11/2024)  Financial Resource Strain: Low Risk (07/11/2023)  Physical Activity: Sufficiently Active (07/11/2024)  Social Connections: Socially Integrated (07/11/2024)  Stress: No Stress Concern Present (07/11/2024)  Tobacco Use: Low Risk (07/11/2024)  Health Literacy: Adequate Health Literacy (07/11/2024)   See flowsheets for full screening details  Depression Screen PHQ 2 & 9 Depression Scale- Over the past 2 weeks, how often have you been bothered by any of the following problems? Little interest or pleasure in doing things: 0 Feeling down, depressed, or hopeless (PHQ Adolescent also includes...irritable): 0 PHQ-2 Total Score: 0 Trouble falling or staying asleep, or sleeping too much: 0 Feeling tired or having little energy: 0 Poor appetite or overeating (PHQ Adolescent also includes...weight loss): 0 Feeling bad about yourself - or that you are a failure or have let yourself or your family down: 0 Trouble concentrating on things, such as reading the newspaper or watching television (PHQ Adolescent also includes...like school work): 0 Moving or speaking so  slowly that other people could have noticed. Or the opposite - being so fidgety or restless that you have been moving around a lot more than usual: 0 Thoughts that you would be better off dead, or of hurting yourself in some way: 0 PHQ-9 Total Score: 0 If you checked off any problems, how difficult have these problems made it for you to do your work, take care of things at home, or get along with other people?: Not difficult at all     Goals Addressed             This Visit's Progress    Exercise 150 min/wk Moderate Activity   On track            Objective:    Today's  Vitals   07/11/24 1230  BP: 134/72  Pulse: (!) 59  Weight: 175 lb (79.4 kg)  Height: 5' 6 (1.676 m)   Body mass index is 28.25 kg/m.  Hearing/Vision screen No results found. Immunizations and Health Maintenance Health Maintenance  Topic Date Due   COVID-19 Vaccine (5 - 2025-26 season) 02/13/2024   Pneumococcal Vaccine: 50+ Years (1 of 1 - PCV) 01/03/2025 (Originally 07/01/2005)   Medicare Annual Wellness (AWV)  07/11/2025   Mammogram  11/01/2025   Bone Density Scan  01/10/2026   Colonoscopy  01/07/2027   DTaP/Tdap/Td (2 - Td or Tdap) 03/18/2030   Influenza Vaccine  Completed   Zoster Vaccines- Shingrix   Completed   Meningococcal B Vaccine  Aged Out   Hepatitis C Screening  Discontinued        Assessment/Plan:  This is a routine wellness examination for Tayley.  Patient Care Team: Jolinda Norene HERO, DO as PCP - General (Family Medicine) Zaldivar, Renzo A, MD as Attending Physician (Ophthalmology) [facility]  I have personally reviewed and noted the following in the patients chart:   Medical and social history Use of alcohol, tobacco or illicit drugs  Current medications and supplements including opioid prescriptions. Functional ability and status Nutritional status Physical activity Advanced directives List of other physicians Hospitalizations, surgeries, and ER visits in previous 12 months Vitals Screenings to include cognitive, depression, and falls Referrals and appointments  No orders of the defined types were placed in this encounter.  In addition, I have reviewed and discussed with patient certain preventive protocols, quality metrics, and best practice recommendations. A written personalized care plan for preventive services as well as general preventive health recommendations were provided to patient.   Ozie Ned, CMA   07/11/2024   Return in 1 year (on 07/11/2025).  After Visit Summary: (MyChart) Due to this being a  telephonic visit, the after visit summary with patients personalized plan was offered to patient via MyChart   Nurse Notes: per pt declines Covid Vaccines "

## 2024-07-11 NOTE — Patient Instructions (Signed)
 Misty Gomez,  Thank you for taking the time for your Medicare Wellness Visit. I appreciate your continued commitment to your health goals. Please review the care plan we discussed, and feel free to reach out if I can assist you further.  Please note that Annual Wellness Visits do not include a physical exam. Some assessments may be limited, especially if the visit was conducted virtually. If needed, we may recommend an in-person follow-up with your provider.  Ongoing Care Seeing your primary care provider every 3 to 6 months helps us  monitor your health and provide consistent, personalized care.   Referrals If a referral was made during today's visit and you haven't received any updates within two weeks, please contact the referred provider directly to check on the status.  Recommended Screenings:  Health Maintenance  Topic Date Due   COVID-19 Vaccine (5 - 2025-26 season) 02/13/2024   Pneumococcal Vaccine for age over 63 (1 of 1 - PCV) 01/03/2025*   Medicare Annual Wellness Visit  07/11/2025   Breast Cancer Screening  11/01/2025   Osteoporosis screening with Bone Density Scan  01/10/2026   Colon Cancer Screening  01/07/2027   DTaP/Tdap/Td vaccine (2 - Td or Tdap) 03/18/2030   Flu Shot  Completed   Zoster (Shingles) Vaccine  Completed   Meningitis B Vaccine  Aged Out   Hepatitis C Screening  Discontinued  *Topic was postponed. The date shown is not the original due date.       07/11/2024   12:02 PM  Advanced Directives  Does Patient Have a Medical Advance Directive? Yes  Type of Estate Agent of [place];Living will  Copy of Healthcare Power of Attorney in Chart? Yes - validated most recent copy scanned in chart (See row information)    Vision: Annual vision screenings are recommended for early detection of glaucoma, cataracts, and diabetic retinopathy. These exams can also reveal signs of chronic conditions such as diabetes and high blood  pressure.  Dental: Annual dental screenings help detect early signs of oral cancer, gum disease, and other conditions linked to overall health, including heart disease and diabetes.  Please see the attached documents for additional preventive care recommendations.

## 2025-01-07 ENCOUNTER — Encounter: Payer: Self-pay | Admitting: Family Medicine

## 2025-07-12 ENCOUNTER — Ambulatory Visit
